# Patient Record
Sex: Female | Born: 1970 | Race: Black or African American | Hispanic: No | Marital: Married | Smoking: Never smoker
Health system: Southern US, Community
[De-identification: ages and names within clinical notes are randomized; demographics above are authoritative.]

## PROBLEM LIST (undated history)

## (undated) DIAGNOSIS — F419 Anxiety disorder, unspecified: Secondary | ICD-10-CM

## (undated) DIAGNOSIS — F32A Depression, unspecified: Secondary | ICD-10-CM

## (undated) DIAGNOSIS — I10 Essential (primary) hypertension: Secondary | ICD-10-CM

## (undated) DIAGNOSIS — G459 Transient cerebral ischemic attack, unspecified: Secondary | ICD-10-CM

## (undated) HISTORY — DX: Transient cerebral ischemic attack, unspecified: G45.9

## (undated) HISTORY — DX: Anxiety disorder, unspecified: F41.9

## (undated) HISTORY — DX: Depression, unspecified: F32.A

---

## 2001-11-24 ENCOUNTER — Emergency Department (HOSPITAL_COMMUNITY): Admission: EM | Admit: 2001-11-24 | Discharge: 2001-11-25 | Payer: Self-pay

## 2004-12-04 ENCOUNTER — Ambulatory Visit: Payer: Self-pay | Admitting: Obstetrics & Gynecology

## 2004-12-24 ENCOUNTER — Observation Stay: Payer: Self-pay

## 2005-02-11 ENCOUNTER — Inpatient Hospital Stay: Payer: Self-pay | Admitting: Obstetrics & Gynecology

## 2005-08-18 ENCOUNTER — Emergency Department: Payer: Self-pay | Admitting: Emergency Medicine

## 2011-11-25 ENCOUNTER — Ambulatory Visit: Payer: Self-pay | Admitting: Emergency Medicine

## 2011-11-25 LAB — HEPATIC FUNCTION PANEL A (ARMC)
Albumin: 3.6 g/dL (ref 3.4–5.0)
Bilirubin, Direct: <0.1 mg/dL (ref 0.00–0.20)
Bilirubin,Total: 0.5 mg/dL (ref 0.2–1.0)
SGOT(AST): 44 U/L — ABNORMAL HIGH (ref 15–37)

## 2011-11-27 LAB — PATHOLOGY REPORT

## 2013-11-22 ENCOUNTER — Encounter: Payer: Self-pay | Admitting: Podiatry

## 2013-11-22 ENCOUNTER — Ambulatory Visit (INDEPENDENT_AMBULATORY_CARE_PROVIDER_SITE_OTHER): Payer: BC Managed Care – PPO

## 2013-11-22 ENCOUNTER — Ambulatory Visit (INDEPENDENT_AMBULATORY_CARE_PROVIDER_SITE_OTHER): Payer: BC Managed Care – PPO | Admitting: Podiatry

## 2013-11-22 VITALS — BP 150/89 | HR 56 | Resp 16 | Ht 65.0 in | Wt 180.0 lb

## 2013-11-22 DIAGNOSIS — M79673 Pain in unspecified foot: Secondary | ICD-10-CM

## 2013-11-22 DIAGNOSIS — M79609 Pain in unspecified limb: Secondary | ICD-10-CM

## 2013-11-22 DIAGNOSIS — S93409A Sprain of unspecified ligament of unspecified ankle, initial encounter: Secondary | ICD-10-CM

## 2013-11-22 NOTE — Progress Notes (Signed)
   Subjective:    Patient ID: Margaret Gaines, female    DOB: 1971/05/31, 43 y.o.   MRN: [id number]  HPI Comments: My right foot is swollen on the ankle and it hurts up the back of the leg. This has been going on for 3 - 4 weeks. It started all the sudden and its remaining the same. i have soaked it in hot water and used muscle rub and elevation.  Foot Pain      Review of Systems  All other systems reviewed and are negative.      Objective:   Physical Exam        Assessment & Plan:

## 2013-11-22 NOTE — Progress Notes (Signed)
Subjective:     Patient ID: Margaret Gaines, female   DOB: 06-18-1971, 43 y.o.   MRN: [id number]  Foot Pain   patient presents stating she has pain in her right ankle and it's not real sore but is swollen and she is concerned she may have broken it or done some   Review of Systems  All other systems reviewed and are negative.      Objective:   Physical Exam  Nursing note and vitals reviewed. Constitutional: She is oriented to person, place, and time.  Cardiovascular: Intact distal pulses.   Musculoskeletal: Normal range of motion.  Neurological: She is oriented to person, place, and time.  Skin: Skin is warm.   vascular status is intact with range of motion of the subtalar and midtarsal joint found to be adequate. I noted there to be discomfort on the fibular malleolus with edema of the malleolus itself and no indication of Achilles tendon injury or peroneal tendon injury. Muscle strength was adequate and digits well perfused     Assessment:     Possible damage to the right ankle lateral side    Plan:     H&P and x-rays reviewed with patient. Advised on compression elevation and anti-inflammatory usage and reappoint if issues occur

## 2014-11-12 NOTE — Op Note (Signed)
PATIENT NAME:  Margaret Gaines, Margaret Gaines MR#:  [id number] DATE OF BIRTH:  10-14-1970  DATE OF PROCEDURE:  11/25/2011  PREOPERATIVE DIAGNOSIS: Acute cholecystitis, cholelithiasis.  POSTOPERATIVE DIAGNOSIS: Acute cholecystitis, cholelithiasis.  PROCEDURE PERFORMED: Laparoscopic cholecystectomy with cholangiogram.  SURGEON: Vella Kohler, MD  FINDINGS: The gallbladder had lots of large stones and was a very difficult dissection near the cystic duct and cystic duct gallbladder junction.   DESCRIPTION OF PROCEDURE: After the patient was put to sleep under general anesthesia, the abdomen was then prepped and draped. A small incision was made above the umbilicus. After cutting skin and subcutaneous tissue, the fascia was cut. The abdomen was entered with a trocar. After that another trocar was inserted in the epigastric region and two 5 mm put in the right upper quadrant of the abdomen. After we looked at the gallbladder, as soon as we went in, there were a lot of adhesions on the surface of the gallbladder. These were released. The duodenum was then pulled down. After that the gallbladder was lifted up. It could not be lifted too high from the liver edge, but another clamp was applied at the Hartmann's pouch. The patient had lots of big, big stones there. It was hard to grasp that area. It also looked the gallbladder was elongated at this area. I could not see the cystic duct in the beginning. In the beginning there were a lot of bands which were going from the Hartmann's pouch. I really cleaned them out and then put a little clip in and dissected them off. After taking off the cystic artery, the cystic duct looked small so I dissected it out very nicely and I did a cholangiogram through it and I could see the common bile duct and I could see the dye in the duodenum with no stones in the common duct. The cystic duct was then clipped right next to the tip of the cystic duct gallbladder junction. After that the gallbladder  was then taken off from the liver bed. The gallbladder had no peritoneum which was mostly intrahepatic so we had a little bit of oozing in the liver bed area, but I kind of stopped the bleeding and dissected up. After that I irrigated it out completely and again at the end we looked at it and placed a piece of Surgicel. After the      gallbladder was removed and all the trocars were removed, the umbilical port was closed with interrupted 0 Vicryl sutures and clips were applied. The patient tolerated the procedure well. ____________________________ Welford Roche. Phylis Bougie, MD msh:slb D: 11/25/2011 14:16:27 ET T: 11/25/2011 15:01:07 ET JOB#: [id number]  cc: Brynlee Pennywell S. Phylis Bougie, MD, <Dictator> Meindert A. Brunetta Genera, Kieler MD ELECTRONICALLY SIGNED 12/03/2011 11:31

## 2017-12-01 ENCOUNTER — Other Ambulatory Visit: Payer: Self-pay

## 2017-12-01 ENCOUNTER — Observation Stay: Payer: BLUE CROSS/BLUE SHIELD

## 2017-12-01 ENCOUNTER — Emergency Department: Payer: BLUE CROSS/BLUE SHIELD

## 2017-12-01 ENCOUNTER — Inpatient Hospital Stay
Admission: EM | Admit: 2017-12-01 | Discharge: 2017-12-03 | DRG: 066 | Disposition: A | Discharge status: Home Health | Payer: BLUE CROSS/BLUE SHIELD | Attending: Internal Medicine | Admitting: Internal Medicine

## 2017-12-01 DIAGNOSIS — Z888 Allergy status to other drugs, medicaments and biological substances status: Secondary | ICD-10-CM

## 2017-12-01 DIAGNOSIS — R519 Headache, unspecified: Secondary | ICD-10-CM

## 2017-12-01 DIAGNOSIS — Z91018 Allergy to other foods: Secondary | ICD-10-CM

## 2017-12-01 DIAGNOSIS — I6381 Other cerebral infarction due to occlusion or stenosis of small artery: Secondary | ICD-10-CM

## 2017-12-01 DIAGNOSIS — Z7982 Long term (current) use of aspirin: Secondary | ICD-10-CM

## 2017-12-01 DIAGNOSIS — I639 Cerebral infarction, unspecified: Principal | ICD-10-CM | POA: Diagnosis present

## 2017-12-01 DIAGNOSIS — Z9114 Patient's other noncompliance with medication regimen: Secondary | ICD-10-CM

## 2017-12-01 DIAGNOSIS — R202 Paresthesia of skin: Secondary | ICD-10-CM | POA: Diagnosis not present

## 2017-12-01 DIAGNOSIS — R51 Headache: Secondary | ICD-10-CM

## 2017-12-01 DIAGNOSIS — R29700 NIHSS score 0: Secondary | ICD-10-CM | POA: Diagnosis present

## 2017-12-01 DIAGNOSIS — I10 Essential (primary) hypertension: Secondary | ICD-10-CM | POA: Diagnosis present

## 2017-12-01 HISTORY — DX: Essential (primary) hypertension: I10

## 2017-12-01 LAB — COMPREHENSIVE METABOLIC PANEL
ALK PHOS: 63 U/L (ref 38–126)
ALT: 19 U/L (ref 14–54)
AST: 27 U/L (ref 15–41)
Albumin: 3.9 g/dL (ref 3.5–5.0)
Anion gap: 7 (ref 5–15)
BUN: 11 mg/dL (ref 6–20)
CALCIUM: 8.9 mg/dL (ref 8.9–10.3)
CO2: 26 mmol/L (ref 22–32)
CREATININE: 0.95 mg/dL (ref 0.44–1.00)
Chloride: 103 mmol/L (ref 101–111)
GFR calc non Af Amer: >60 mL/min (ref >60)
GLUCOSE: 101 mg/dL — AB (ref 65–99)
Potassium: 3.7 mmol/L (ref 3.5–5.1)
SODIUM: 136 mmol/L (ref 135–145)
Total Bilirubin: 0.5 mg/dL (ref 0.3–1.2)
Total Protein: 7.4 g/dL (ref 6.5–8.1)

## 2017-12-01 LAB — CBC WITH DIFFERENTIAL/PLATELET
Basophils Absolute: 0 10*3/uL (ref 0–0.1)
Basophils Relative: 1 %
EOS PCT: 4 %
Eosinophils Absolute: 0.1 10*3/uL (ref 0–0.7)
HCT: 37.5 % (ref 35.0–47.0)
HEMOGLOBIN: 12.8 g/dL (ref 12.0–16.0)
LYMPHS ABS: 1.2 10*3/uL (ref 1.0–3.6)
Lymphocytes Relative: 35 %
MCH: 28.8 pg (ref 26.0–34.0)
MCHC: 34.1 g/dL (ref 32.0–36.0)
MCV: 84.3 fL (ref 80.0–100.0)
MONO ABS: 0.2 10*3/uL (ref 0.2–0.9)
MONOS PCT: 6 %
NEUTROS PCT: 54 %
Neutro Abs: 1.8 10*3/uL (ref 1.4–6.5)
Platelets: 232 10*3/uL (ref 150–440)
RBC: 4.45 MIL/uL (ref 3.80–5.20)
RDW: 15.2 % — ABNORMAL HIGH (ref 11.5–14.5)
WBC: 3.5 10*3/uL — ABNORMAL LOW (ref 3.6–11.0)

## 2017-12-01 LAB — TROPONIN I: Troponin I: <0.03 ng/mL (ref <0.03)

## 2017-12-01 MED ORDER — HYDRALAZINE HCL 20 MG/ML IJ SOLN
10.0000 mg | Freq: Once | INTRAMUSCULAR | Status: AC
  Administered 2017-12-01: 10 mg via INTRAVENOUS
  Filled 2017-12-01: qty 1

## 2017-12-01 MED ORDER — ACETAMINOPHEN 160 MG/5ML PO SOLN
650.0000 mg | ORAL | Status: DC | PRN
  Filled 2017-12-01: qty 20.3

## 2017-12-01 MED ORDER — LABETALOL HCL 5 MG/ML IV SOLN
10.0000 mg | Freq: Once | INTRAVENOUS | Status: AC
  Administered 2017-12-01: 10 mg via INTRAVENOUS

## 2017-12-01 MED ORDER — ONDANSETRON HCL 4 MG/2ML IJ SOLN
4.0000 mg | INTRAMUSCULAR | Status: AC
  Administered 2017-12-01: 4 mg via INTRAVENOUS
  Filled 2017-12-01 (×2): qty 2

## 2017-12-01 MED ORDER — ACETAMINOPHEN 650 MG RE SUPP
650.0000 mg | RECTAL | Status: DC | PRN
  Administered 2017-12-03: 650 mg via RECTAL
  Filled 2017-12-01: qty 1

## 2017-12-01 MED ORDER — MORPHINE SULFATE (PF) 4 MG/ML IV SOLN
4.0000 mg | Freq: Once | INTRAVENOUS | Status: AC
  Administered 2017-12-01: 4 mg via INTRAVENOUS
  Filled 2017-12-01 (×2): qty 1

## 2017-12-01 MED ORDER — LABETALOL HCL 5 MG/ML IV SOLN
INTRAVENOUS | Status: AC
  Administered 2017-12-01: 19:00:00
  Filled 2017-12-01: qty 4

## 2017-12-01 MED ORDER — STROKE: EARLY STAGES OF RECOVERY BOOK
Freq: Once | Status: AC
  Administered 2017-12-02

## 2017-12-01 MED ORDER — ASPIRIN 81 MG PO CHEW
324.0000 mg | CHEWABLE_TABLET | Freq: Once | ORAL | Status: AC
  Administered 2017-12-01: 324 mg via ORAL
  Filled 2017-12-01: qty 4

## 2017-12-01 MED ORDER — ACETAMINOPHEN 325 MG PO TABS
650.0000 mg | ORAL_TABLET | ORAL | Status: DC | PRN
  Administered 2017-12-02 (×2): 650 mg via ORAL
  Filled 2017-12-01 (×2): qty 2

## 2017-12-01 MED ORDER — ENOXAPARIN SODIUM 40 MG/0.4ML ~~LOC~~ SOLN
40.0000 mg | SUBCUTANEOUS | Status: DC
  Administered 2017-12-02: 40 mg via SUBCUTANEOUS
  Filled 2017-12-01: qty 0.4

## 2017-12-01 NOTE — ED Notes (Signed)
Pt reports headache is back and would like pain meds at this time. VSS.

## 2017-12-01 NOTE — ED Notes (Signed)
Patient transported to MRI via stretcher.

## 2017-12-01 NOTE — ED Notes (Signed)
Patient is resting comfortably on stretcher. VSS. Pt awaiting MRI. Call bell within reach, will continue to monitor.

## 2017-12-01 NOTE — ED Provider Notes (Signed)
[facility] Emergency Department Provider Note  ____________________________________________   First MD Initiated Contact with Patient 12/01/17 1742     (approximate)  I have reviewed the triage vital signs and the nursing notes.   HISTORY  Chief Complaint Headache and Hypertension    HPI Margaret Gaines is a 47 y.o. female with medical history of poorly controlled hypertension and questionable compliance to her medication regimen who presents for evaluation of gradually worsening but severe headache.  She reports that the headache started gradually 3 days ago but got worse today.  This morning she also noticed some numbness down her left arm and to the left side of her lips which was new but is been present for longer than 6 hours today.  She denies any chest pain or shortness of breath.  Light does make her headache worse.  She has no history of migraine.  She has had no weakness in her arms or legs and no difficulty with ambulation although she has had dizziness and elevated blood pressure.  Her husband confirmed that she only intermittently takes her blood pressure medicine and she definitely has not taken any for the last 3 days.  She denies fever/chills, neck pain, neck stiffness, chest pain, shortness of breath, nausea, vomiting, abdominal pain, and dysuria.  Past Medical History:  Diagnosis Date  . Hypertension     There are no active problems to display for this patient.   Past Surgical History:  Procedure Laterality Date  . CESAREAN SECTION     x2     Prior to Admission medications   Medication Sig Start Date End Date Taking? Authorizing Provider  aspirin EC 81 MG tablet Take 81 mg by mouth daily.   Yes [provider]  lisinopril (PRINIVIL,ZESTRIL) 20 MG tablet Take 20 mg by mouth daily.   Yes [provider]  Multiple Vitamin (MULTIVITAMIN WITH MINERALS) TABS tablet Take 1 tablet by mouth daily.   Yes [provider]    Allergies Silicone and Apricot flavor  History reviewed. No pertinent family history.  Social History Social History   Tobacco Use  . Smoking status: Never Smoker  Substance Use Topics  . Alcohol use: Yes  . Drug use: Not on file    Review of Systems Constitutional: No fever/chills Eyes: No visual changes.  +photophobia ENT: No sore throat. Cardiovascular: Denies chest pain. Respiratory: Denies shortness of breath. Gastrointestinal: No abdominal pain.  No nausea, no vomiting.  No diarrhea.  No constipation. Genitourinary: Negative for dysuria. Musculoskeletal: Negative for neck pain.  Negative for back pain. Integumentary: Negative for rash. Neurological: Severe headache for 3 days, now with greater than 6 hours of numbness in her left upper extremity and left side of her lips.  No focal weakness, no difficulty with ambulation.  Generalized dizziness.   ____________________________________________   PHYSICAL EXAM:  VITAL SIGNS: ED Triage Vitals  Enc Vitals Group     BP 12/01/17 1745 (!) 228/127     Pulse Rate 12/01/17 1748 (!) 52     Resp 12/01/17 1745 14     Temp --      Temp src --      SpO2 12/01/17 1745 98 %     Weight 12/01/17 1749 79.4 kg (175 lb)     Height 12/01/17 1749 1.626 m (5\' 4" )     Head Circumference --      Peak Flow --      Pain Score 12/01/17 1749 9  Pain Loc --      Pain Edu? --      Excl. in [place]? --     Constitutional: Alert and oriented but slow to respond and appears uncomfortable. Eyes: Conjunctivae are normal. PERRL. EOMI. Head: Atraumatic. Nose: No congestion/rhinnorhea. Mouth/Throat: Mucous membranes are moist. Neck: No stridor.  No meningeal signs.   Cardiovascular: Normal rate, regular rhythm. Good peripheral circulation. Grossly normal heart sounds. Respiratory: Normal respiratory effort.  No retractions. Lungs CTAB. Gastrointestinal: Soft and nontender. No distention.  Musculoskeletal: No lower extremity tenderness  nor edema. No gross deformities of extremities. Neurologic:  Normal speech and language. No gross focal neurologic deficits are appreciated with good major muscle group strength throughout but still the subjective finding of some numbness in her left arm although she reports it is better than prior. Skin:  Skin is warm, dry and intact. No rash noted. Psychiatric: Mood and affect are normal. Speech and behavior are normal.  ____________________________________________   LABS (all labs ordered are listed, but only abnormal results are displayed)  Labs Reviewed  CBC WITH DIFFERENTIAL/PLATELET - Abnormal; Notable for the following components:      Result Value   WBC 3.5 (*)    RDW 15.2 (*)    All other components within normal limits  COMPREHENSIVE METABOLIC PANEL - Abnormal; Notable for the following components:   Glucose, Bld 101 (*)    All other components within normal limits  TROPONIN I  URINALYSIS, ROUTINE W REFLEX MICROSCOPIC   ____________________________________________  EKG  ED ECG REPORT I, Hinda Kehr, the attending physician, personally viewed and interpreted this ECG.  Date: 12/01/2017 EKG Time: 17: 24 Rate: 51 Rhythm: Sinus bradycardia QRS Axis: normal Intervals: normal ST/T Wave abnormalities: Non-specific ST segment / T-wave changes, but no evidence of acute ischemia. Narrative Interpretation: no evidence of acute ischemia   ____________________________________________  RADIOLOGY   ED MD interpretation: No acute abnormalities on noncontrast head CT according to radiology.  MR brain is pending.  Official radiology report(s): Ct Head Wo Contrast  Result Date: 12/01/2017 CLINICAL DATA:  Severe headache dizziness EXAM: CT HEAD WITHOUT CONTRAST TECHNIQUE: Contiguous axial images were obtained from the base of the skull through the vertex without intravenous contrast. COMPARISON:  None. FINDINGS: Brain: No evidence of acute infarction, hemorrhage,  hydrocephalus, extra-axial collection or mass lesion/mass effect. Vascular: Negative for hyperdense vessel Skull: Negative Sinuses/Orbits: Negative Other: None IMPRESSION: Normal CT head Electronically Signed   By: Franchot Gallo M.D.   On: 12/01/2017 18:28    ____________________________________________   PROCEDURES  Critical Care performed: Yes, see critical care procedure note(s)   Procedure(s) performed:   .Critical Care Performed by: Hinda Kehr, MD Authorized by: Hinda Kehr, MD   Critical care provider statement:    Critical care time (minutes):  30   Critical care time was exclusive of:  Separately billable procedures and treating other patients   Critical care was necessary to treat or prevent imminent or life-threatening deterioration of the following conditions: hypertensive emergency.   Critical care was time spent personally by me on the following activities:  Development of treatment plan with patient or surrogate, discussions with consultants, evaluation of patient's response to treatment, examination of patient, obtaining history from patient or surrogate, ordering and performing treatments and interventions, ordering and review of laboratory studies, ordering and review of radiographic studies, pulse oximetry, re-evaluation of patient's condition and review of old charts     ____________________________________________   [place] / [place] / ED  COURSE  As part of my medical decision making, I reviewed the following data within the [facility] notes reviewed and incorporated, Labs reviewed , EKG interpreted  and Patient signed out to Dr. Joni Fears.    Differential diagnosis includes, but is not limited to, migraine, hypertensive emergency, CVA, intracranial neoplasm, intracranial hemorrhage, less likely infectious process.  The patient's blood pressure is consistently elevated in the 228/120 range.  She is  noncompliant with her medication I am sure she is chronically hypertensive but this seems extreme.  Even if this is a CVA this is too high.  Her heart rate is only in the 50s so rather than a beta-blocker I will give her first dose of hydralazine 10 mg IV to see if this helps both with her blood pressure as well as with her symptoms.  I will hold off on any analgesia right now because I do want to see the effect of the blood pressure.  I will also obtain a noncontrast head CT to look for any acute abnormalities including hemorrhage although this is unlikely given that her symptoms have been gradually getting worse for 3 days.  Lab work is unremarkable with normal conference of metabolic panel and CBC other than a very slight leukopenia at 3.5 WBC.  Troponin is negative.   Clinical Course as of Dec 01 2040  Tue Dec 01, 2017  1839 Normal CT head without any evidence of acute intracranial bleeding  CT Head Wo Contrast [CF]  1850 Patient still having headache and her blood pressures currently about 195/110 with a heart rate of about 72.  I will try labetalol 10 mg IV to see how she responds.  Lab work is reassuring with no evidence of renal dysfunction and CBC is within normal limits.  Troponin is negative.   [CF]  1907 Patient's blood pressure improved slightly after the labetalol.  She said her headache is down to an 8 or 9.  However it is possible that she is actually having a mild CVA which is leading her body to become hypertensive rather than this being a hypertensive emergency.  Given the risks of me continuing to treat the hypertension when she should actually be allowed to have permissive hypertension, I am ordering an MR brain without contrast to look for any evidence of CVA.  Her blood pressure is now in an appropriate range for permissive hypertension (it still needed to be treated earlier).  She does not have any numbness or tingling in her left arm now which is reassuring so she would not be a  good candidate for TPA even if this was a CVA given the rapid improvement in symptoms and the initial high blood pressure.  I explained the need for the MR brain to the patient and her husband and they understand and agree with the plan.   [CF]  1928 For her persistent headache I am giving morphine 4 mg IV as well as Zofran 4 mg IV while we are awaiting the MRI.   [CF]  2033 Patient's headache resolved, refused morphine.  Awaiting MRI.  Blood pressure still elevated.  Transferring care to Dr. Joni Fears to follow up MRI.  Likely will need admission for uncontrolled hypertension / hypertensive emergency, but awaiting any additional BP treatment until we know if she requires permissive hypertension.   [CF]    Clinical Course User Index [CF] Hinda Kehr, MD    ____________________________________________  FINAL CLINICAL IMPRESSION(S) / ED DIAGNOSES  Final diagnoses:  Uncontrolled hypertension  Nonintractable headache, unspecified chronicity pattern, unspecified headache type  Paresthesia     MEDICATIONS GIVEN DURING THIS VISIT:  Medications  morphine 4 MG/ML injection 4 mg (0 mg Intravenous Hold 12/01/17 1959)  ondansetron (ZOFRAN) injection 4 mg (0 mg Intravenous Hold 12/01/17 2000)  hydrALAZINE (APRESOLINE) injection 10 mg (10 mg Intravenous Given 12/01/17 1822)  labetalol (NORMODYNE,TRANDATE) injection 10 mg (10 mg Intravenous Given 12/01/17 1855)  labetalol (NORMODYNE,TRANDATE) 5 MG/ML injection (  Given by Other 12/01/17 1856)     ED Discharge Orders    None       Note:  This document was prepared using Dragon voice recognition software and may include unintentional dictation errors.    Hinda Kehr, MD 12/01/17 2042

## 2017-12-01 NOTE — ED Provider Notes (Signed)
 -----------------------------------------   10:31 PM on 12/01/2017 -----------------------------------------  Assumed care from Dr. Karma Greaser with MRI pending to evaluate for possible stroke. 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 We'll hold off on further antihypertensives, plan for permissive hypertension to maintain cerebral perfusion in at risk area. I will give aspirin. Case discussed with the hospitalist for further stroke management.  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

## 2017-12-01 NOTE — ED Triage Notes (Signed)
Pt arrived via EMS and sent from urgent care for headache, dizziness and high BP. Reports HA started 3 days ago and got worse today. BP high for EMS 190's/100's. Pt reports numbness to left arm and lips. Pt denies CP or SOB. Pt CAOx4.

## 2017-12-01 NOTE — H&P (Signed)
[facility] [place] NAME: Margaret Gaines    MR#:  [id number]  DATE OF BIRTH:  03-05-1971  DATE OF ADMISSION:  12/01/2017  PRIMARY CARE PHYSICIAN: Patient, No Pcp Per   REQUESTING/REFERRING PHYSICIAN: Joni Fears, MD  CHIEF COMPLAINT:   Chief Complaint  Patient presents with  . Headache  . Hypertension    HISTORY OF PRESENT ILLNESS:  Margaret Gaines  is a 47 y.o. female who presents with headache and development of some left-sided sensory deficit.  Patient states that she woke up with a headache, and was going about her business today when her headache got worse around 10:00 am, and she started to develop some facial and left-sided sensory deficit.  Patient went home to lay down for a while and states that her symptoms did not get any better.  She came to ED for evaluation and here was found to have a new small stroke on MRI imaging.  Hospitalist were called for admission  PAST MEDICAL HISTORY:   Past Medical History:  Diagnosis Date  . Hypertension      PAST SURGICAL HISTORY:   Past Surgical History:  Procedure Laterality Date  . CESAREAN SECTION     x2      SOCIAL HISTORY:   Social History   Tobacco Use  . Smoking status: Never Smoker  Substance Use Topics  . Alcohol use: Yes     FAMILY HISTORY:  Family history reviewed and is non-contributory   DRUG ALLERGIES:   Allergies  Allergen Reactions  . Silicone   . Apricot Flavor Other (See Comments)    Tingling to lips    MEDICATIONS AT HOME:   Prior to Admission medications   Medication Sig Start Date End Date Taking? Authorizing Provider  aspirin EC 81 MG tablet Take 81 mg by mouth daily.   Yes [provider]  lisinopril (PRINIVIL,ZESTRIL) 20 MG tablet Take 20 mg by mouth daily.   Yes [provider]  Multiple Vitamin (MULTIVITAMIN WITH MINERALS) TABS tablet Take 1 tablet by mouth daily.   Yes [provider]    REVIEW OF  SYSTEMS:  Review of Systems  Constitutional: Negative for chills, fever, malaise/fatigue and weight loss.  HENT: Negative for ear pain, hearing loss and tinnitus.   Eyes: Negative for blurred vision, double vision, pain and redness.  Respiratory: Negative for cough, hemoptysis and shortness of breath.   Cardiovascular: Negative for chest pain, palpitations, orthopnea and leg swelling.  Gastrointestinal: Negative for abdominal pain, constipation, diarrhea, nausea and vomiting.  Genitourinary: Negative for dysuria, frequency and hematuria.  Musculoskeletal: Negative for back pain, joint pain and neck pain.  Skin:       No acne, rash, or lesions  Neurological: Positive for sensory change and headaches. Negative for dizziness, tremors, focal weakness and weakness.  Endo/Heme/Allergies: Negative for polydipsia. Does not bruise/bleed easily.  Psychiatric/Behavioral: Negative for depression. The patient is not nervous/anxious and does not have insomnia.      VITAL SIGNS:   Vitals:   12/01/17 1840 12/01/17 1850 12/01/17 1930 12/01/17 2100  BP: (!) 195/110 (!) 184/105 (!) 184/100 (!) 167/120  Pulse: 65 68 (!) 59 60  Resp: 13 12 17 11   SpO2: 100% 100% 100% 99%  Weight:      Height:       Wt Readings from Last 3 Encounters:  12/01/17 79.4 kg (175 lb)  11/22/13 81.6 kg (180 lb)    PHYSICAL EXAMINATION:  Physical Exam  Vitals reviewed. Constitutional: She is oriented to person, place, and time. She appears well-developed and well-nourished. No distress.  HENT:  Head: Normocephalic and atraumatic.  Mouth/Throat: Oropharynx is clear and moist.  Eyes: Pupils are equal, round, and reactive to light. Conjunctivae and EOM are normal. No scleral icterus.  Neck: Normal range of motion. Neck supple. No JVD present. No thyromegaly present.  Cardiovascular: Normal rate, regular rhythm and intact distal pulses. Exam reveals no gallop and no friction rub.  No murmur heard. Respiratory: Effort  normal and breath sounds normal. No respiratory distress. She has no wheezes. She has no rales.  GI: Soft. Bowel sounds are normal. She exhibits no distension. There is no tenderness.  Musculoskeletal: Normal range of motion. She exhibits no edema.  No arthritis, no gout  Lymphadenopathy:    She has no cervical adenopathy.  Neurological: She is alert and oriented to person, place, and time. No cranial nerve deficit.  Neurologic: Cranial nerves II-XII intact, Sensation intact to light touch/pinprick, though patient endorses difference in sensation between right and left upper extremity -endorsing some tingling in mild sensory deficit in her left upper extremity, 5/5 strength in all extremities, no dysarthria, no aphasia, no dysphagia  Skin: Skin is warm and dry. No rash noted. No erythema.  Psychiatric: She has a normal mood and affect. Her behavior is normal. Judgment and thought content normal.    LABORATORY PANEL:   CBC Recent Labs  Lab 12/01/17 1739  WBC 3.5*  HGB 12.8  HCT 37.5  PLT 232   ------------------------------------------------------------------------------------------------------------------  Chemistries  Recent Labs  Lab 12/01/17 1739  NA 136  K 3.7  CL 103  CO2 26  GLUCOSE 101*  BUN 11  CREATININE 0.95  CALCIUM 8.9  AST 27  ALT 19  ALKPHOS 63  BILITOT 0.5   ------------------------------------------------------------------------------------------------------------------  Cardiac Enzymes Recent Labs  Lab 12/01/17 1739  TROPONINI <0.03   ------------------------------------------------------------------------------------------------------------------  RADIOLOGY:  Ct Head Wo Contrast  Result Date: 12/01/2017 CLINICAL DATA:  Severe headache dizziness EXAM: CT HEAD WITHOUT CONTRAST TECHNIQUE: Contiguous axial images were obtained from the base of the skull through the vertex without intravenous contrast. COMPARISON:  None. FINDINGS: Brain: No evidence  of acute infarction, hemorrhage, hydrocephalus, extra-axial collection or mass lesion/mass effect. Vascular: Negative for hyperdense vessel Skull: Negative Sinuses/Orbits: Negative Other: None IMPRESSION: Normal CT head Electronically Signed   By: Franchot Gallo M.D.   On: 12/01/2017 18:28   Mr Brain Wo Contrast  Result Date: 12/01/2017 CLINICAL DATA:  47 y/o F; severe gradually worsening headache with numbness of the left arm and left side of lips. History of hypertension. EXAM: MRI HEAD WITHOUT CONTRAST TECHNIQUE: Multiplanar, multiecho pulse sequences of the brain and surrounding structures were obtained without intravenous contrast. COMPARISON:  12/01/2017 head CT. FINDINGS: Brain: 10 mm focus of reduced diffusion within the right ventral thalamus compatible with acute/early subacute infarction. No associated hemorrhage or mass effect. Few additional scattered punctate foci of T2 FLAIR hyperintense signal abnormality in white matter are compatible with mild chronic microvascular ischemic changes for age. No extra-axial collection, hydrocephalus, or effacement of basilar cisterns. Vascular: Normal flow voids. Skull and upper cervical spine: Normal marrow signal. Sinuses/Orbits: Negative. Other: None. IMPRESSION: 10 mm acute/early subacute infarction within the right ventral thalamus. No associated hemorrhage or mass effect. Mild chronic microvascular ischemic changes of the brain for age. These results were called by telephone at the time of interpretation on 12/01/2017 at 10:19 pm to Dr. Joni Fears, who verbally acknowledged these  results. Electronically Signed   By: Kristine Garbe M.D.   On: 12/01/2017 22:21    EKG:   Orders placed or performed during the hospital encounter of 12/01/17  . EKG 12-Lead  . EKG 12-Lead    IMPRESSION AND PLAN:  Principal Problem:   Stroke [facility]) -admit per stroke admission order set with appropriate imaging, labs, consults. Active Problems:   Hypertension  -permissive hypertension tonight, blood pressure goal less than 220/120, hold antihypertensives for now  Chart review performed and case discussed with ED provider. Labs, imaging and/or ECG reviewed by provider and discussed with patient/family. Management plans discussed with the patient and/or family.  DVT PROPHYLAXIS: SubQ lovenox  GI PROPHYLAXIS: None  ADMISSION STATUS: Observation  CODE STATUS: Full  TOTAL TIME TAKING CARE OF THIS PATIENT: 40 minutes.   Vinie Charity [place] 12/01/2017, 10:40 PM  [employer]  [phone]  CC: Primary care physician; Patient, No Pcp Per  Note:  This document was prepared using Dragon voice recognition software and may include unintentional dictation errors.

## 2017-12-02 ENCOUNTER — Encounter: Payer: Self-pay | Admitting: Neurology

## 2017-12-02 ENCOUNTER — Observation Stay
Admit: 2017-12-02 | Discharge: 2017-12-02 | Disposition: A | Discharge status: Home / Self Care | Payer: BLUE CROSS/BLUE SHIELD | Attending: Internal Medicine | Admitting: Internal Medicine

## 2017-12-02 DIAGNOSIS — I10 Essential (primary) hypertension: Secondary | ICD-10-CM | POA: Diagnosis present

## 2017-12-02 DIAGNOSIS — Z888 Allergy status to other drugs, medicaments and biological substances status: Secondary | ICD-10-CM | POA: Diagnosis not present

## 2017-12-02 DIAGNOSIS — R29700 NIHSS score 0: Secondary | ICD-10-CM | POA: Diagnosis present

## 2017-12-02 DIAGNOSIS — Z91018 Allergy to other foods: Secondary | ICD-10-CM | POA: Diagnosis not present

## 2017-12-02 DIAGNOSIS — R202 Paresthesia of skin: Secondary | ICD-10-CM | POA: Diagnosis present

## 2017-12-02 DIAGNOSIS — I63531 Cerebral infarction due to unspecified occlusion or stenosis of right posterior cerebral artery: Secondary | ICD-10-CM | POA: Diagnosis not present

## 2017-12-02 DIAGNOSIS — Z7982 Long term (current) use of aspirin: Secondary | ICD-10-CM | POA: Diagnosis not present

## 2017-12-02 DIAGNOSIS — Z9114 Patient's other noncompliance with medication regimen: Secondary | ICD-10-CM | POA: Diagnosis not present

## 2017-12-02 DIAGNOSIS — I639 Cerebral infarction, unspecified: Secondary | ICD-10-CM | POA: Diagnosis present

## 2017-12-02 LAB — LIPID PANEL
CHOLESTEROL: 172 mg/dL (ref 0–200)
HDL: 46 mg/dL (ref >40)
LDL CALC: 109 mg/dL — AB (ref 0–99)
Total CHOL/HDL Ratio: 3.7 RATIO
Triglycerides: 87 mg/dL (ref <150)
VLDL: 17 mg/dL (ref 0–40)

## 2017-12-02 LAB — ECHOCARDIOGRAM COMPLETE
Height: 64 in
Weight: 3022.4 oz

## 2017-12-02 LAB — SEDIMENTATION RATE: SED RATE: 9 mm/h (ref 0–20)

## 2017-12-02 LAB — HEMOGLOBIN A1C
HEMOGLOBIN A1C: 5.4 % (ref 4.8–5.6)
MEAN PLASMA GLUCOSE: 108.28 mg/dL

## 2017-12-02 LAB — ANTITHROMBIN III: AntiThromb III Func: 112 % (ref 75–120)

## 2017-12-02 MED ORDER — HYDROCODONE-ACETAMINOPHEN 5-325 MG PO TABS
1.0000 | ORAL_TABLET | ORAL | Status: DC | PRN
  Administered 2017-12-02: 1 via ORAL
  Filled 2017-12-02: qty 1

## 2017-12-02 MED ORDER — HYDRALAZINE HCL 20 MG/ML IJ SOLN
10.0000 mg | Freq: Four times a day (QID) | INTRAMUSCULAR | Status: DC | PRN
  Administered 2017-12-02 (×2): 10 mg via INTRAVENOUS
  Filled 2017-12-02 (×2): qty 1

## 2017-12-02 MED ORDER — IRBESARTAN 75 MG PO TABS
37.5000 mg | ORAL_TABLET | Freq: Once | ORAL | Status: AC
  Administered 2017-12-02: 37.5 mg via ORAL
  Filled 2017-12-02: qty 0.5

## 2017-12-02 MED ORDER — PANTOPRAZOLE SODIUM 40 MG PO TBEC
40.0000 mg | DELAYED_RELEASE_TABLET | Freq: Every day | ORAL | Status: DC
  Administered 2017-12-02 – 2017-12-03 (×2): 40 mg via ORAL
  Filled 2017-12-02 (×2): qty 1

## 2017-12-02 MED ORDER — AMLODIPINE BESYLATE 5 MG PO TABS
5.0000 mg | ORAL_TABLET | Freq: Every day | ORAL | Status: DC
  Administered 2017-12-02 – 2017-12-03 (×2): 5 mg via ORAL
  Filled 2017-12-02 (×2): qty 1

## 2017-12-02 MED ORDER — HYDROCHLOROTHIAZIDE 25 MG PO TABS
25.0000 mg | ORAL_TABLET | Freq: Every day | ORAL | Status: DC
  Administered 2017-12-02 – 2017-12-03 (×2): 25 mg via ORAL
  Filled 2017-12-02 (×2): qty 1

## 2017-12-02 MED ORDER — GADOBENATE DIMEGLUMINE 529 MG/ML IV SOLN
20.0000 mL | Freq: Once | INTRAVENOUS | Status: AC | PRN
  Administered 2017-12-02: 01:00:00 16 mL via INTRAVENOUS

## 2017-12-02 MED ORDER — CLOPIDOGREL BISULFATE 75 MG PO TABS
75.0000 mg | ORAL_TABLET | Freq: Every day | ORAL | Status: DC
  Administered 2017-12-02 – 2017-12-03 (×2): 75 mg via ORAL
  Filled 2017-12-02 (×2): qty 1

## 2017-12-02 MED ORDER — ROSUVASTATIN CALCIUM 10 MG PO TABS
10.0000 mg | ORAL_TABLET | Freq: Every day | ORAL | Status: DC
  Administered 2017-12-02 – 2017-12-03 (×2): 10 mg via ORAL
  Filled 2017-12-02 (×2): qty 1

## 2017-12-02 NOTE — Progress Notes (Signed)
Patient is having difficulties swallowing pills. Patient is able to eat food without any issues. Patient reports that the swallowing of pills is so difficult that it's the reason she do not take the pills at home. Patient's husband, mother, and father are all at the bedside. MD Mody notified.

## 2017-12-02 NOTE — Progress Notes (Signed)
Took over pt care at 1500, family at bedside, pt resting quietly with no complaints

## 2017-12-02 NOTE — Progress Notes (Signed)
[facility] [facility] [place] NAME: Margaret Gaines    MR#:  [id number]  DATE OF BIRTH:  07/25/1970  SUBJECTIVE:  Patient presented with headache and dizziness.  Her symptoms are improving.  No other neurological deficit noted.  REVIEW OF SYSTEMS:    Review of Systems  Constitutional: Negative for fever, chills weight loss HENT: Negative for ear pain, nosebleeds, congestion, facial swelling, rhinorrhea, neck pain, neck stiffness and ear discharge.   Respiratory: Negative for cough, shortness of breath, wheezing  Cardiovascular: Negative for chest pain, palpitations and leg swelling.  Gastrointestinal: Negative for heartburn, abdominal pain, vomiting, diarrhea or consitpation Genitourinary: Negative for dysuria, urgency, frequency, hematuria Musculoskeletal: Negative for back pain or joint pain Neurological: Positive for headache and dizziness however improved Hematological: Does not bruise/bleed easily.  Psychiatric/Behavioral: Negative for hallucinations, confusion, dysphoric mood    Tolerating Diet: yes      DRUG ALLERGIES:   Allergies  Allergen Reactions  . Silicone   . Apricot Flavor Other (See Comments)    Tingling to lips    VITALS:  Blood pressure (!) 157/114, pulse (!) 58, temperature 97.7 F (36.5 C), temperature source Oral, resp. rate 17, height 5\' 4"  (1.626 m), weight 85.7 kg (188 lb 14.4 oz), last menstrual period 12/01/2017, SpO2 100 %.  PHYSICAL EXAMINATION:  Constitutional: Appears well-developed and well-nourished. No distress. HENT: Normocephalic. Marland Kitchen Oropharynx is clear and moist.  Eyes: Conjunctivae and EOM are normal. PERRLA, no scleral icterus.  Neck: Normal ROM. Neck supple. No JVD. No tracheal deviation. CVS: RRR, S1/S2 +, no murmurs, no gallops, no carotid bruit.  Pulmonary: Effort and breath sounds normal, no stridor, rhonchi, wheezes, rales.  Abdominal: Soft. BS +,  no distension, tenderness, rebound or  guarding.  Musculoskeletal: Normal range of motion. No edema and no tenderness.  Neuro: Alert. CN 2-12 grossly intact. No focal deficits. Skin: Skin is warm and dry. No rash noted. Psychiatric: Normal mood and affect.      LABORATORY PANEL:   CBC Recent Labs  Lab 12/01/17 1739  WBC 3.5*  HGB 12.8  HCT 37.5  PLT 232   ------------------------------------------------------------------------------------------------------------------  Chemistries  Recent Labs  Lab 12/01/17 1739  NA 136  K 3.7  CL 103  CO2 26  GLUCOSE 101*  BUN 11  CREATININE 0.95  CALCIUM 8.9  AST 27  ALT 19  ALKPHOS 63  BILITOT 0.5   ------------------------------------------------------------------------------------------------------------------  Cardiac Enzymes Recent Labs  Lab 12/01/17 1739  TROPONINI <0.03   ------------------------------------------------------------------------------------------------------------------  RADIOLOGY:  Ct Head Wo Contrast  Result Date: 12/01/2017 CLINICAL DATA:  Severe headache dizziness EXAM: CT HEAD WITHOUT CONTRAST TECHNIQUE: Contiguous axial images were obtained from the base of the skull through the vertex without intravenous contrast. COMPARISON:  None. FINDINGS: Brain: No evidence of acute infarction, hemorrhage, hydrocephalus, extra-axial collection or mass lesion/mass effect. Vascular: Negative for hyperdense vessel Skull: Negative Sinuses/Orbits: Negative Other: None IMPRESSION: Normal CT head Electronically Signed   By: Franchot Gallo M.D.   On: 12/01/2017 18:28   Mr Jodene Nam Neck W Wo Contrast  Result Date: 12/02/2017 CLINICAL DATA:  47 y/o  F; headache and left-sided sensory deficit. EXAM: MRA NECK WITHOUT AND WITH CONTRAST MRA HEAD WITHOUT CONTRAST TECHNIQUE: Multiplanar and multiecho pulse sequences of the neck were obtained without and with intravenous contrast. Angiographic images of the neck were obtained using MRA technique without and with  intravenous contast.; Angiographic images of the Circle of Willis were obtained using MRA technique without intravenous  contrast. CONTRAST:  13mL MULTIHANCE GADOBENATE DIMEGLUMINE 529 MG/ML IV SOLN COMPARISON:  12/01/2017 CT head FINDINGS: MRA NECK FINDINGS Aortic arch: Patent.  Bovine variant branching. Right common carotid artery: Patent. Right internal carotid artery: Patent. Right vertebral artery: Patent. Left common carotid artery: Patent. Left Internal carotid artery: Patent. Left Vertebral artery: Patent. There is no evidence of hemodynamically significant stenosis by NASCET criteria, occlusion, or aneurysm unless noted above. MRA HEAD FINDINGS Internal carotid arteries:  Patent. Anterior cerebral arteries:  Patent.  Azygos A2. Middle cerebral arteries: Patent. Anterior communicating artery: Patent. Posterior communicating arteries:  Patent. Apparent segments of stenosis of left terminal ICA, bilateral A1, and bilateral M1 segments are are widely patent on the fluid postcontrast MRA of the neck and likely due to motion artifact/sinus susceptibility artifact. Posterior cerebral arteries: Patent. Severe right P2 stenosis also present on the postcontrast MRA of the neck (series 15, image 43). Basilar artery:  Patent. Vertebral arteries:  Patent. No large vessel occlusion or aneurysm identified. IMPRESSION: 1. Patent carotid and vertebral arteries. No dissection, aneurysm, or hemodynamically significant stenosis utilizing NASCET criteria. 2. Patent anterior and posterior intracranial circulation. No large vessel occlusion or aneurysm. 3. 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 Otherwise, no significant stenosis. Electronically Signed   By: Kristine Garbe M.D.   On: 12/02/2017 02:01   Mr Brain Wo Contrast  Result Date: 12/01/2017 CLINICAL DATA:  47 y/o F; severe gradually worsening headache with numbness of the left arm and left side of lips. History of hypertension. EXAM: MRI  HEAD WITHOUT CONTRAST TECHNIQUE: Multiplanar, multiecho pulse sequences of the brain and surrounding structures were obtained without intravenous contrast. COMPARISON:  12/01/2017 head CT. FINDINGS: Brain: 10 mm focus of reduced diffusion within the right ventral thalamus compatible with acute/early subacute infarction. No associated hemorrhage or mass effect. Few additional scattered punctate foci of T2 FLAIR hyperintense signal abnormality in white matter are compatible with mild chronic microvascular ischemic changes for age. No extra-axial collection, hydrocephalus, or effacement of basilar cisterns. Vascular: Normal flow voids. Skull and upper cervical spine: Normal marrow signal. Sinuses/Orbits: Negative. Other: None. IMPRESSION: 10 mm acute/early subacute infarction within the right ventral thalamus. No associated hemorrhage or mass effect. Mild chronic microvascular ischemic changes of the brain for age. These results were called by telephone at the time of interpretation on 12/01/2017 at 10:19 pm to Dr. Joni Fears, who verbally acknowledged these results. Electronically Signed   By: Kristine Garbe M.D.   On: 12/01/2017 22:21   Mr Jodene Nam Head/brain YY Cm  Result Date: 12/02/2017 CLINICAL DATA:  47 y/o  F; headache and left-sided sensory deficit. EXAM: MRA NECK WITHOUT AND WITH CONTRAST MRA HEAD WITHOUT CONTRAST TECHNIQUE: Multiplanar and multiecho pulse sequences of the neck were obtained without and with intravenous contrast. Angiographic images of the neck were obtained using MRA technique without and with intravenous contast.; Angiographic images of the Circle of Willis were obtained using MRA technique without intravenous contrast. CONTRAST:  64mL MULTIHANCE GADOBENATE DIMEGLUMINE 529 MG/ML IV SOLN COMPARISON:  12/01/2017 CT head FINDINGS: MRA NECK FINDINGS Aortic arch: Patent.  Bovine variant branching. Right common carotid artery: Patent. Right internal carotid artery: Patent. Right vertebral  artery: Patent. Left common carotid artery: Patent. Left Internal carotid artery: Patent. Left Vertebral artery: Patent. There is no evidence of hemodynamically significant stenosis by NASCET criteria, occlusion, or aneurysm unless noted above. MRA HEAD FINDINGS Internal carotid arteries:  Patent. Anterior cerebral arteries:  Patent.  Azygos A2. Middle cerebral arteries: Patent. Anterior  communicating artery: Patent. Posterior communicating arteries:  Patent. Apparent segments of stenosis of left terminal ICA, bilateral A1, and bilateral M1 segments are are widely patent on the fluid postcontrast MRA of the neck and likely due to motion artifact/sinus susceptibility artifact. Posterior cerebral arteries: Patent. Severe right P2 stenosis also present on the postcontrast MRA of the neck (series 15, image 43). Basilar artery:  Patent. Vertebral arteries:  Patent. No large vessel occlusion or aneurysm identified. IMPRESSION: 1. Patent carotid and vertebral arteries. No dissection, aneurysm, or hemodynamically significant stenosis utilizing NASCET criteria. 2. Patent anterior and posterior intracranial circulation. No large vessel occlusion or aneurysm. 3. 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 Otherwise, no significant stenosis. Electronically Signed   By: Kristine Garbe M.D.   On: 12/02/2017 02:01     ASSESSMENT AND PLAN:   47 year old female with history of essential hypertension who presented with headache and dizziness.  1.10 mm acute/early subacute infarction within the right ventral  thalamus. No associated hemorrhage or mass effect.  Patient will need TEE. Start Plavix and statin Appreciate neurology consultation MRI/MRA as above LDL 109 A1c 5.4  2.  Accelerated essential hypertension due to CVA Start HCTZ and Norvasc   Management plans discussed with the patient and she is in agreement.  CODE STATUS: full  TOTAL TIME TAKING CARE OF THIS PATIENT: 30 minutes.    D/w dr Doy Mince  POSSIBLE D/C tomorrow, DEPENDING ON CLINICAL CONDITION.   Audrey Thull M.D on 12/02/2017 at 11:18 AM  Between 7am to 6pm - Pager - [phone] After 6pm go to [email or web address] - password [email or web address] [facility]  Office  [phone]  CC: Primary care physician; Patient, No Pcp Per  Note: This dictation was prepared with Dragon dictation along with smaller phrase technology. Any transcriptional errors that result from this process are unintentional.

## 2017-12-02 NOTE — Evaluation (Signed)
Physical Therapy Evaluation Patient Details Name: ASHAUNTE STANDLEY MRN: [id number] DOB: Mar 21, 1971 Today's Date: 12/02/2017   History of Present Illness  Patient is a 47 year old female admitted for a thalamic stroke following c/o N/T in facial area.  PMH includes Htn.  Clinical Impression  Patient is a 47 year old female who lives in one story home with her husband.  She is independent and exercises regularly at baseline.  Pt reports an intense HA when PT arrived and states that her only sx are N/T in R side of face and lips.  Pt able to perform bed mobility independently and demonstrate good sitting balance.  MMT reveals good (4+/5, 5/5) bilaterally in LE/UE.  Pt required unilateral UE to stand, using counter and appeared slightly unsteady on feet.  She was able ambulate in room 50 ft with occasional unilateral use of furniture to steady herself.  Pt gait, static balance testing and dynamic balance reveal balance deficits and fall risk.  PT discussed possibility of using SPC for fall prevention during the recovery process and pt expressed interest.  All coordination testing WNL.  Pt will benefit from skilled PT with focus on HEP, balance and fall prevention as well as proper use of AD.    Follow Up Recommendations Home health PT    Equipment Recommendations  None recommended by PT    Recommendations for Other Services       Precautions / Restrictions Precautions Precautions: Fall Restrictions Weight Bearing Restrictions: No      Mobility  Bed Mobility Overal bed mobility: Independent                Transfers Overall transfer level: Needs assistance   Transfers: Sit to/from Stand Sit to Stand: Supervision         General transfer comment: Pt used counter to pull herself up to standing, appearing unsteady on feet.  No LOB.  Ambulation/Gait Ambulation/Gait assistance: Supervision Ambulation Distance (Feet): 50 Feet       Gait velocity interpretation: 1.31 - 2.62  ft/sec, indicative of limited community ambulator General Gait Details: Good foot clearance adn hip and knee flexion with occasional furniture cruising and mild lateral deviation.  Pt states that this is not baseline.  Stairs            Wheelchair Mobility    Modified Rankin (Stroke Patients Only)       Balance Overall balance assessment: Needs assistance Sitting-balance support: Feet supported Sitting balance-Leahy Scale: Normal     Standing balance support: Single extremity supported Standing balance-Leahy Scale: Fair Standing balance comment: Occasional need for unilateral UE to steady herself     Tandem Stance - Right Leg: 8(Increased lateral sway) Tandem Stance - Left Leg: 8(Increased lateral sway.) Rhomberg - Eyes Opened: 10 Rhomberg - Eyes Closed: 10(Increased a-p sway with close CGA)                 Pertinent Vitals/Pain Pain Assessment: Faces Faces Pain Scale: Hurts whole lot Pain Descriptors / Indicators: Headache Pain Intervention(s): Limited activity within patient's tolerance    Home Living Family/patient expects to be discharged to:: Private residence Living Arrangements: Spouse/significant other Available Help at Discharge: Family;Available PRN/intermittently Type of Home: House Home Access: Stairs to enter Entrance Stairs-Rails: Can reach both Entrance Stairs-Number of Steps: 5 Home Layout: One level Home Equipment: None Additional Comments: Pt's husband states that they will purchase a cane if needed.    Prior Function Level of Independence: Independent  Comments: Pt is active and exercises regularly.     Hand Dominance        Extremity/Trunk Assessment   Upper Extremity Assessment Upper Extremity Assessment: Overall WFL for tasks assessed(Grossly 4+/5 bilaterally)    Lower Extremity Assessment Lower Extremity Assessment: Overall WFL for tasks assessed(Grossly 5/5 bilaterally)    Cervical / Trunk  Assessment Cervical / Trunk Assessment: Normal  Communication   Communication: No difficulties  Cognition Arousal/Alertness: Lethargic Behavior During Therapy: Restless Overall Cognitive Status: Within Functional Limits for tasks assessed                                 General Comments: Pt able to answer questions and follow commands.  Slow to respond at times but appeared to be in a lot of pain due to HA, closing eyes frequently.      General Comments      Exercises     Assessment/Plan    PT Assessment Patient needs continued PT services  PT Problem List Decreased balance;Decreased knowledge of use of DME       PT Treatment Interventions DME instruction;Therapeutic activities;Gait training;Therapeutic exercise;Patient/family education;Stair training;Balance training;Functional mobility training;Neuromuscular re-education    PT Goals (Current goals can be found in the Care Plan section)  Acute Rehab PT Goals Patient Stated Goal: To return home and back to exercise routine. PT Goal Formulation: With patient/family Time For Goal Achievement: 12/16/17 Potential to Achieve Goals: Good    Frequency 7X/week   Barriers to discharge        Co-evaluation               AM-PAC PT "6 Clicks" Daily Activity  Outcome Measure Difficulty turning over in bed (including adjusting bedclothes, sheets and blankets)?: None Difficulty moving from lying on back to sitting on the side of the bed? : A Little Difficulty sitting down on and standing up from a chair with arms (e.g., wheelchair, bedside commode, etc,.)?: A Little Help needed moving to and from a bed to chair (including a wheelchair)?: A Little Help needed walking in hospital room?: A Little Help needed climbing 3-5 steps with a railing? : A Little 6 Click Score: 19    End of Session Equipment Utilized During Treatment: Gait belt Activity Tolerance: Patient limited by pain Patient left: in bed;with call  bell/phone within reach;with bed alarm set;with family/visitor present   PT Visit Diagnosis: Unsteadiness on feet (R26.81)    Time: 1140-1200 PT Time Calculation (min) (ACUTE ONLY): 20 min   Charges:   PT Evaluation $PT Eval Low Complexity: 1 Low     PT G Codes:   PT G-Codes **NOT FOR INPATIENT CLASS** Functional Assessment Tool Used: AM-PAC 6 Clicks Basic Mobility    Roxanne Gates, PT, DPT   Roxanne Gates 12/02/2017, 12:14 PM

## 2017-12-02 NOTE — Progress Notes (Signed)
*  PRELIMINARY RESULTS* Echocardiogram 2D Echocardiogram has been performed.  Margaret Gaines 12/02/2017, 9:07 AM

## 2017-12-02 NOTE — Progress Notes (Signed)
SLP Cancellation Note  Patient Details Name: Margaret Gaines MRN: [id number] DOB: 1971/06/22   Cancelled treatment:       Reason Eval/Treat Not Completed: SLP screened, no needs identified, will sign off(chart reviewed; consulted NSG then met w/ pt/family). Pt denied any difficulty swallowing and is currently on a regular diet; tolerates swallowing pills w/ water per NSG. Pt conversed at conversational level w/out deficits noted; pt and family denied any speech-language deficits. Pt had questions and concerns re: her s/s of Reflux-like issues - encouraged her to talk w/ MD re: such and need for management of.  No further skilled ST services indicated as pt appears at her baseline. Pt agreed. NSG to reconsult if any change in status.    Orinda Kenner, MS, CCC-SLP Watson,Katherine 12/02/2017, 12:12 PM

## 2017-12-02 NOTE — Progress Notes (Signed)
OT Cancellation Note  Patient Details Name: Margaret Gaines MRN: [id number] DOB: 10-27-1970   Cancelled Treatment:    Reason Eval/Treat Not Completed: Fatigue/lethargy limiting ability to participate(Pt. sleeping soundly. Pt. family requested to let pt. sleep secondary to having had a headache. Pt. BP has been elevated throughout the day, with the most recent reading 191/120. Will reattempt initial OT visit on the next available date.)  Harrel Carina, MS, OTR/L 12/02/2017, 3:27 PM

## 2017-12-02 NOTE — Consult Note (Signed)
Referring Physician: Mody    Chief Complaint: Left sided numbness  HPI: AALYSSA ELDERKIN is an 47 y.o. female with a history of hypertension who has not been compliant with her medications.  Reports that about 2 weeks ago had the onset of numbness on her tongue and lower lip.  Symptoms remained and yesterday she developed a headache.  At about 10-1030a also noted development of numbness on the left side of her body.  With no improvement in symptoms presented for evaluation.  Initial NIHSS of 0.    Date last known well: Unable to determine Time last known well: Unable to determine tPA Given: No: Outside treatment window  Past Medical History:  Diagnosis Date  . Hypertension     Past Surgical History:  Procedure Laterality Date  . CESAREAN SECTION     x2     Family history: no family history of stroke  Social History:  reports that she has never smoked. She has never used smokeless tobacco. She reports that she drinks alcohol. Her drug history is not on file.  Allergies:  Allergies  Allergen Reactions  . Silicone   . Apricot Flavor Other (See Comments)    Tingling to lips    Medications:  I have reviewed the patient's current medications. Prior to Admission:  Medications Prior to Admission  Medication Sig Dispense Refill Last Dose  . aspirin EC 81 MG tablet Take 81 mg by mouth daily.     Marland Kitchen lisinopril (PRINIVIL,ZESTRIL) 20 MG tablet Take 20 mg by mouth daily.     . Multiple Vitamin (MULTIVITAMIN WITH MINERALS) TABS tablet Take 1 tablet by mouth daily.      Scheduled: . enoxaparin (LOVENOX) injection  40 mg Subcutaneous Q24H    ROS: History obtained from the patient  General ROS: negative for - chills, fatigue, fever, night sweats, weight gain or weight loss Psychological ROS: negative for - behavioral disorder, hallucinations, memory difficulties, mood swings or suicidal ideation Ophthalmic ROS: negative for - blurry vision, double vision, eye pain or loss of vision ENT  ROS: negative for - epistaxis, nasal discharge, oral lesions, sore throat, tinnitus or vertigo Allergy and Immunology ROS: negative for - hives or itchy/watery eyes Hematological and Lymphatic ROS: negative for - bleeding problems, bruising or swollen lymph nodes Endocrine ROS: negative for - galactorrhea, hair pattern changes, polydipsia/polyuria or temperature intolerance Respiratory ROS: negative for - cough, hemoptysis, shortness of breath or wheezing Cardiovascular ROS: negative for - chest pain, dyspnea on exertion, edema or irregular heartbeat Gastrointestinal ROS: negative for - abdominal pain, diarrhea, hematemesis, nausea/vomiting or stool incontinence Genito-Urinary ROS: negative for - dysuria, hematuria, incontinence or urinary frequency/urgency Musculoskeletal ROS: negative for - joint swelling or muscular weakness Neurological ROS: as noted in HPI Dermatological ROS: negative for rash and skin lesion changes  Physical Examination: Blood pressure (!) 157/114, pulse (!) 58, temperature 97.7 F (36.5 C), temperature source Oral, resp. rate 17, height _0  (1.626 m), weight 85.7 kg (188 lb 14.4 oz), last menstrual period 12/01/2017, SpO2 100 %.  HEENT-  Normocephalic, no lesions, without obvious abnormality.  Normal external eye and conjunctiva.  Normal TM's bilaterally.  Normal auditory canals and external ears. Normal external nose, mucus membranes and septum.  Normal pharynx. Cardiovascular- S1, S2 normal, pulses palpable throughout   Lungs- chest clear, no wheezing, rales, normal symmetric air entry Abdomen- soft, non-tender; bowel sounds normal; no masses,  no organomegaly Extremities- no edema Lymph-no adenopathy palpable Musculoskeletal-no joint tenderness, deformity or swelling Skin-warm  and dry, no hyperpigmentation, vitiligo, or suspicious lesions  Neurological Examination   Mental Status: Alert, oriented, thought content appropriate.  Speech fluent without evidence of  aphasia.  Able to follow 3 step commands without difficulty. Cranial Nerves: II: Discs flat bilaterally; Visual fields grossly normal, pupils equal, round, reactive to light and accommodation III,IV, VI: ptosis not present, extra-ocular motions intact bilaterally V,VII: smile symmetric, facial light touch sensation normal bilaterally VIII: hearing normal bilaterally IX,X: gag reflex present XI: bilateral shoulder shrug XII: midline tongue extension Motor: Right : Upper extremity   5/5    Left:     Upper extremity   5/5  Lower extremity   5/5     Lower extremity   5/5 Tone and bulk:normal tone throughout; no atrophy noted Sensory: Pinprick and light touch intact decreased in the left upper and lower extremities Deep Tendon Reflexes: 2+ and symmetric with absent AJ;s bilaterally Plantars: Right: downgoing   Left: downgoing Cerebellar: Normal finger-to-nose and normal heel-to-shin testing bilaterally Gait: not tested due to safety concerns    Laboratory Studies:  Basic Metabolic Panel: Recent Labs  Lab 12/01/17 1739  NA 136  K 3.7  CL 103  CO2 26  GLUCOSE 101*  BUN 11  CREATININE 0.95  CALCIUM 8.9    Liver Function Tests: Recent Labs  Lab 12/01/17 1739  AST 27  ALT 19  ALKPHOS 63  BILITOT 0.5  PROT 7.4  ALBUMIN 3.9   No results for input(s): LIPASE, AMYLASE in the last 168 hours. No results for input(s): AMMONIA in the last 168 hours.  CBC: Recent Labs  Lab 12/01/17 1739  WBC 3.5*  NEUTROABS 1.8  HGB 12.8  HCT 37.5  MCV 84.3  PLT 232    Cardiac Enzymes: Recent Labs  Lab 12/01/17 1739  TROPONINI <0.03    BNP: Invalid input(s): POCBNP  CBG: No results for input(s): GLUCAP in the last 168 hours.  Microbiology: No results found for this or any previous visit.  Coagulation Studies: No results for input(s): LABPROT, INR in the last 72 hours.  Urinalysis: No results for input(s): COLORURINE, LABSPEC, PHURINE, GLUCOSEU, HGBUR, BILIRUBINUR,  KETONESUR, PROTEINUR, UROBILINOGEN, NITRITE, LEUKOCYTESUR in the last 168 hours.  Invalid input(s): APPERANCEUR  Lipid Panel:    Component Value Date/Time   CHOL 172 12/02/2017 0526   TRIG 87 12/02/2017 0526   HDL 46 12/02/2017 0526   CHOLHDL 3.7 12/02/2017 0526   VLDL 17 12/02/2017 0526   LDLCALC 109 (H) 12/02/2017 0526    HgbA1C:  Lab Results  Component Value Date   HGBA1C 5.4 12/02/2017    Urine Drug Screen:  No results found for: LABOPIA, COCAINSCRNUR, LABBENZ, AMPHETMU, THCU, LABBARB  Alcohol Level: No results for input(s): ETH in the last 168 hours.  Other results: EKG: sinus rhythm at 51 bpm.  Imaging: Ct Head Wo Contrast  Result Date: 12/01/2017 CLINICAL DATA:  Severe headache dizziness EXAM: CT HEAD WITHOUT CONTRAST TECHNIQUE: Contiguous axial images were obtained from the base of the skull through the vertex without intravenous contrast. COMPARISON:  None. FINDINGS: Brain: No evidence of acute infarction, hemorrhage, hydrocephalus, extra-axial collection or mass lesion/mass effect. Vascular: Negative for hyperdense vessel Skull: Negative Sinuses/Orbits: Negative Other: None IMPRESSION: Normal CT head Electronically Signed   By: Franchot Gallo M.D.   On: 12/01/2017 18:28   Mr Jodene Nam Neck W Wo Contrast  Result Date: 12/02/2017 CLINICAL DATA:  47 y/o  F; headache and left-sided sensory deficit. EXAM: MRA NECK WITHOUT AND WITH CONTRAST  MRA HEAD WITHOUT CONTRAST TECHNIQUE: Multiplanar and multiecho pulse sequences of the neck were obtained without and with intravenous contrast. Angiographic images of the neck were obtained using MRA technique without and with intravenous contast.; Angiographic images of the Circle of Willis were obtained using MRA technique without intravenous contrast. CONTRAST:  78m MULTIHANCE GADOBENATE DIMEGLUMINE 529 MG/ML IV SOLN COMPARISON:  12/01/2017 CT head FINDINGS: MRA NECK FINDINGS Aortic arch: Patent.  Bovine variant branching. Right common carotid  artery: Patent. Right internal carotid artery: Patent. Right vertebral artery: Patent. Left common carotid artery: Patent. Left Internal carotid artery: Patent. Left Vertebral artery: Patent. There is no evidence of hemodynamically significant stenosis by NASCET criteria, occlusion, or aneurysm unless noted above. MRA HEAD FINDINGS Internal carotid arteries:  Patent. Anterior cerebral arteries:  Patent.  Azygos A2. Middle cerebral arteries: Patent. Anterior communicating artery: Patent. Posterior communicating arteries:  Patent. Apparent segments of stenosis of left terminal ICA, bilateral A1, and bilateral M1 segments are are widely patent on the fluid postcontrast MRA of the neck and likely due to motion artifact/sinus susceptibility artifact. Posterior cerebral arteries: Patent. Severe right P2 stenosis also present on the postcontrast MRA of the neck (series 15, image 43). Basilar artery:  Patent. Vertebral arteries:  Patent. No large vessel occlusion or aneurysm identified. IMPRESSION: 1. Patent carotid and vertebral arteries. No dissection, aneurysm, or hemodynamically significant stenosis utilizing NASCET criteria. 2. Patent anterior and posterior intracranial circulation. No large vessel occlusion or aneurysm. 3. 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 Otherwise, no significant stenosis. Electronically Signed   By: LKristine GarbeM.D.   On: 12/02/2017 02:01   Mr Brain Wo Contrast  Result Date: 12/01/2017 CLINICAL DATA:  47y/o F; severe gradually worsening headache with numbness of the left arm and left side of lips. History of hypertension. EXAM: MRI HEAD WITHOUT CONTRAST TECHNIQUE: Multiplanar, multiecho pulse sequences of the brain and surrounding structures were obtained without intravenous contrast. COMPARISON:  12/01/2017 head CT. FINDINGS: Brain: 10 mm focus of reduced diffusion within the right ventral thalamus compatible with acute/early subacute infarction. No  associated hemorrhage or mass effect. Few additional scattered punctate foci of T2 FLAIR hyperintense signal abnormality in white matter are compatible with mild chronic microvascular ischemic changes for age. No extra-axial collection, hydrocephalus, or effacement of basilar cisterns. Vascular: Normal flow voids. Skull and upper cervical spine: Normal marrow signal. Sinuses/Orbits: Negative. Other: None. IMPRESSION: 10 mm acute/early subacute infarction within the right ventral thalamus. No associated hemorrhage or mass effect. Mild chronic microvascular ischemic changes of the brain for age. These results were called by telephone at the time of interpretation on 12/01/2017 at 10:19 pm to Dr. SJoni Fears who verbally acknowledged these results. Electronically Signed   By: LKristine GarbeM.D.   On: 12/01/2017 22:21   Mr MJodene NamHead/brain WTZCm  Result Date: 12/02/2017 CLINICAL DATA:  47y/o  F; headache and left-sided sensory deficit. EXAM: MRA NECK WITHOUT AND WITH CONTRAST MRA HEAD WITHOUT CONTRAST TECHNIQUE: Multiplanar and multiecho pulse sequences of the neck were obtained without and with intravenous contrast. Angiographic images of the neck were obtained using MRA technique without and with intravenous contast.; Angiographic images of the Circle of Willis were obtained using MRA technique without intravenous contrast. CONTRAST:  154mMULTIHANCE GADOBENATE DIMEGLUMINE 529 MG/ML IV SOLN COMPARISON:  12/01/2017 CT head FINDINGS: MRA NECK FINDINGS Aortic arch: Patent.  Bovine variant branching. Right common carotid artery: Patent. Right internal carotid artery: Patent. Right vertebral artery: Patent. Left common carotid  artery: Patent. Left Internal carotid artery: Patent. Left Vertebral artery: Patent. There is no evidence of hemodynamically significant stenosis by NASCET criteria, occlusion, or aneurysm unless noted above. MRA HEAD FINDINGS Internal carotid arteries:  Patent. Anterior cerebral arteries:   Patent.  Azygos A2. Middle cerebral arteries: Patent. Anterior communicating artery: Patent. Posterior communicating arteries:  Patent. Apparent segments of stenosis of left terminal ICA, bilateral A1, and bilateral M1 segments are are widely patent on the fluid postcontrast MRA of the neck and likely due to motion artifact/sinus susceptibility artifact. Posterior cerebral arteries: Patent. Severe right P2 stenosis also present on the postcontrast MRA of the neck (series 15, image 43). Basilar artery:  Patent. Vertebral arteries:  Patent. No large vessel occlusion or aneurysm identified. IMPRESSION: 1. Patent carotid and vertebral arteries. No dissection, aneurysm, or hemodynamically significant stenosis utilizing NASCET criteria. 2. Patent anterior and posterior intracranial circulation. No large vessel occlusion or aneurysm. 3. 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 Otherwise, no significant stenosis. Electronically Signed   By: Kristine Garbe M.D.   On: 12/02/2017 02:01    Assessment: 47 y.o. female presenting with hemisensory complaints.  MRI of the brain reviewed and shows an acute right thalamic infarct.  There is also evidence of other chronic small ischemic areas consistent with small vessel ischemic changes.  Although with patient being noncompliant with antihypertensives this is likely secondary to small vessel disease with other ischemic changes noted at young age will rule out embolic etiologies as well.   MRA of head and neck reveals severe right P2 segment stenosis.  Echocardiogram pending.  A1c 5.4, LDL 109.  Patient on ASA prior to admission.    Stroke Risk Factors - hypertension  Plan: 1. Hypercoagulable panel, ESR 2. PT consult, OT consult, Speech consult 3. Echocardiogram pending.  If unremarkable, TEE recommended.   4. Prophylactic therapy-Antiplatelet med: Plavix - dose 86m daily 5. NPO until RN stroke swallow screen 6. Telemetry monitoring 7.  Frequent neuro checks 8. Statin for lipid management with target LDL<70.   LAlexis Goodell MD Neurology [phone]/15/2019, 10:43 AM

## 2017-12-02 NOTE — Plan of Care (Signed)
Pt c/o of severe headache.  No improvement with tylenol.  Had given pt 10 mg hydralazine at 2024 for BP of 183/96.  It got down to 161/95; but when we rechecked before calling prime doc it was back up to 181/92.  Pt had gotten up to BR and could barely stand - had to be wheeled back to bed.  Prime doc put in order for Norco and irbesartan 37.5 mg.

## 2017-12-03 ENCOUNTER — Encounter: Payer: Self-pay | Admitting: Cardiovascular Disease

## 2017-12-03 ENCOUNTER — Inpatient Hospital Stay (HOSPITAL_COMMUNITY)
Admit: 2017-12-03 | Discharge: 2017-12-03 | Disposition: A | Discharge status: Home / Self Care | Payer: BLUE CROSS/BLUE SHIELD | Attending: Internal Medicine | Admitting: Internal Medicine

## 2017-12-03 ENCOUNTER — Encounter
Admission: EM | Disposition: A | Discharge status: Home Health | Payer: Self-pay | Source: Home / Self Care | Attending: Internal Medicine

## 2017-12-03 ENCOUNTER — Inpatient Hospital Stay: Admit: 2017-12-03 | Payer: BLUE CROSS/BLUE SHIELD

## 2017-12-03 DIAGNOSIS — I63531 Cerebral infarction due to unspecified occlusion or stenosis of right posterior cerebral artery: Secondary | ICD-10-CM

## 2017-12-03 HISTORY — PX: TEE WITHOUT CARDIOVERSION: SHX5443

## 2017-12-03 LAB — PROTEIN S ACTIVITY: PROTEIN S ACTIVITY: 78 % (ref 63–140)

## 2017-12-03 LAB — LUPUS ANTICOAGULANT PANEL
DRVVT: 30.2 s (ref 0.0–47.0)
PTT Lupus Anticoagulant: 27.6 s (ref 0.0–51.9)

## 2017-12-03 LAB — PROTEIN C ACTIVITY: PROTEIN C ACTIVITY: 145 % (ref 73–180)

## 2017-12-03 LAB — ECHOCARDIOGRAM COMPLETE
Height: 64 in
Weight: 3022.4 oz

## 2017-12-03 LAB — HIV ANTIBODY (ROUTINE TESTING W REFLEX): HIV Screen 4th Generation wRfx: NONREACTIVE

## 2017-12-03 LAB — PROTEIN S, TOTAL: Protein S Ag, Total: 98 % (ref 60–150)

## 2017-12-03 LAB — HOMOCYSTEINE: Homocysteine: 9.1 umol/L (ref 0.0–15.0)

## 2017-12-03 SURGERY — ECHOCARDIOGRAM, TRANSESOPHAGEAL
Anesthesia: Choice

## 2017-12-03 MED ORDER — FENTANYL CITRATE (PF) 100 MCG/2ML IJ SOLN
INTRAMUSCULAR | Status: AC
  Filled 2017-12-03: qty 4

## 2017-12-03 MED ORDER — AMLODIPINE BESYLATE 10 MG PO TABS: 10.0000 mg | ORAL_TABLET | Freq: Every day | ORAL | 0 refills | Status: AC

## 2017-12-03 MED ORDER — FENTANYL CITRATE (PF) 100 MCG/2ML IJ SOLN
INTRAMUSCULAR | Status: AC | PRN
  Administered 2017-12-03: 50 ug via INTRAVENOUS

## 2017-12-03 MED ORDER — HYDROCHLOROTHIAZIDE 25 MG PO TABS: 25.0000 mg | ORAL_TABLET | Freq: Every day | ORAL | 0 refills | Status: DC

## 2017-12-03 MED ORDER — PANTOPRAZOLE SODIUM 40 MG PO TBEC: 40.0000 mg | DELAYED_RELEASE_TABLET | Freq: Every day | ORAL | 0 refills | Status: DC

## 2017-12-03 MED ORDER — MIDAZOLAM HCL 5 MG/5ML IJ SOLN
INTRAMUSCULAR | Status: AC
  Filled 2017-12-03: qty 10

## 2017-12-03 MED ORDER — AMLODIPINE BESYLATE 10 MG PO TABS: 10.0000 mg | ORAL_TABLET | Freq: Every day | ORAL | 0 refills | Status: DC

## 2017-12-03 MED ORDER — LISINOPRIL 10 MG PO TABS: 10.0000 mg | ORAL_TABLET | Freq: Every day | ORAL | Status: DC

## 2017-12-03 MED ORDER — HYDROCHLOROTHIAZIDE 25 MG PO TABS: 25.0000 mg | ORAL_TABLET | Freq: Every day | ORAL | 0 refills | Status: AC

## 2017-12-03 MED ORDER — AMLODIPINE BESYLATE 10 MG PO TABS: 10.0000 mg | ORAL_TABLET | Freq: Every day | ORAL | Status: DC

## 2017-12-03 MED ORDER — MIDAZOLAM HCL 2 MG/2ML IJ SOLN
INTRAMUSCULAR | Status: AC | PRN
  Administered 2017-12-03: 2 mg via INTRAVENOUS

## 2017-12-03 MED ORDER — ROSUVASTATIN CALCIUM 10 MG PO TABS: 10.0000 mg | ORAL_TABLET | Freq: Every day | ORAL | 0 refills | Status: AC

## 2017-12-03 MED ORDER — SODIUM CHLORIDE 0.9 % IV SOLN: INTRAVENOUS | Status: DC

## 2017-12-03 MED ORDER — BUTAMBEN-TETRACAINE-BENZOCAINE 2-2-14 % EX AERO
INHALATION_SPRAY | CUTANEOUS | Status: AC
  Filled 2017-12-03: qty 5

## 2017-12-03 MED ORDER — CLOPIDOGREL BISULFATE 75 MG PO TABS: 75.0000 mg | ORAL_TABLET | Freq: Every day | ORAL | 0 refills | Status: DC

## 2017-12-03 NOTE — Care Management Note (Signed)
Case Management Note  Patient Details  Name: Margaret Gaines MRN: [id number] Date of Birth: 07/17/1971  Subjective/Objective:                  Admitted to [facility] with the diagnosis of stroke. Lives with husband Margaret Gaines [phone]). Last seen Dr. Brunetta Genera 12/02/17. Prescriptions are filled at [facility] in [place]. No home health or skilled nursing facilities in the past. No medical equipment in the home. Works at a job. Takes care of all basic and instrumental activities of daily living herself, drives. Family will transport  Action/Plan: Physical therapy evaluation completed. Recommending home with home health. Declining services at this time, not home bound.    Expected Discharge Date:  12/03/17               Expected Discharge Plan:     In-House Referral:     Discharge planning Services     Post Acute Care Choice:    Choice offered to:     DME Arranged:    DME Agency:     HH Arranged:    HH Agency:     Status of Service:     If discussed at [employer], dates discussed:    Additional Comments:  Shelbie Ammons, RN MSN CCM Care Management [phone] 12/03/2017, 2:51 PM

## 2017-12-03 NOTE — Progress Notes (Signed)
Subjective: No new neurological complaints.  Had severe headache on yesterday that is improved today.  Objective: Current vital signs: BP (!) 142/92   Pulse 63   Temp 98.7 F (37.1 C) (Oral)   Resp 15   Ht 5\' 4"  (1.626 m)   Wt 85.7 kg (188 lb 14.4 oz)   LMP 12/01/2017   SpO2 100%   BMI 32.42 kg/m  Vital signs in last 24 hours: Temp:  [98.2 F (36.8 C)-99 F (37.2 C)] 98.7 F (37.1 C) (05/16 0754) Pulse Rate:  [57-72] 63 (05/16 0754) Resp:  [15-18] 15 (05/16 0754) BP: (131-191)/(62-120) 142/92 (05/16 0754) SpO2:  [100 %] 100 % (05/16 0754)  Intake/Output from previous day: 05/15 0701 - 05/16 0700 In: 360 [P.O.:360] Out: 2050 [Urine:2050] Intake/Output this shift: No intake/output data recorded. Nutritional status:  Diet Order           Diet NPO time specified  Diet effective now          Neurologic Exam: Mental Status: Alert, oriented, thought content appropriate.  Speech fluent without evidence of aphasia.  Able to follow 3 step commands without difficulty. Cranial Nerves: II: Discs flat bilaterally; Visual fields grossly normal, pupils equal, round, reactive to light and accommodation III,IV, VI: ptosis not present, extra-ocular motions intact bilaterally V,VII: smile symmetric, facial light touch sensation normal bilaterally VIII: hearing normal bilaterally IX,X: gag reflex present XI: bilateral shoulder shrug XII: midline tongue extension Motor: 5/5 throughout Sensory: Pinprick and light touch intact bilaterally  Lab Results: Basic Metabolic Panel: Recent Labs  Lab 12/01/17 1739  NA 136  K 3.7  CL 103  CO2 26  GLUCOSE 101*  BUN 11  CREATININE 0.95  CALCIUM 8.9    Liver Function Tests: Recent Labs  Lab 12/01/17 1739  AST 27  ALT 19  ALKPHOS 63  BILITOT 0.5  PROT 7.4  ALBUMIN 3.9   No results for input(s): LIPASE, AMYLASE in the last 168 hours. No results for input(s): AMMONIA in the last 168 hours.  CBC: Recent Labs  Lab  12/01/17 1739  WBC 3.5*  NEUTROABS 1.8  HGB 12.8  HCT 37.5  MCV 84.3  PLT 232    Cardiac Enzymes: Recent Labs  Lab 12/01/17 1739  TROPONINI <0.03    Lipid Panel: Recent Labs  Lab 12/02/17 0526  CHOL 172  TRIG 87  HDL 46  CHOLHDL 3.7  VLDL 17  LDLCALC 109*    CBG: No results for input(s): GLUCAP in the last 168 hours.  Microbiology: No results found for this or any previous visit.  Coagulation Studies: No results for input(s): LABPROT, INR in the last 72 hours.  Imaging: Ct Head Wo Contrast  Result Date: 12/01/2017 CLINICAL DATA:  Severe headache dizziness EXAM: CT HEAD WITHOUT CONTRAST TECHNIQUE: Contiguous axial images were obtained from the base of the skull through the vertex without intravenous contrast. COMPARISON:  None. FINDINGS: Brain: No evidence of acute infarction, hemorrhage, hydrocephalus, extra-axial collection or mass lesion/mass effect. Vascular: Negative for hyperdense vessel Skull: Negative Sinuses/Orbits: Negative Other: None IMPRESSION: Normal CT head Electronically Signed   By: Franchot Gallo M.D.   On: 12/01/2017 18:28   Mr Jodene Nam Neck W Wo Contrast  Result Date: 12/02/2017 CLINICAL DATA:  47 y/o  F; headache and left-sided sensory deficit. EXAM: MRA NECK WITHOUT AND WITH CONTRAST MRA HEAD WITHOUT CONTRAST TECHNIQUE: Multiplanar and multiecho pulse sequences of the neck were obtained without and with intravenous contrast. Angiographic images of the neck were obtained  using MRA technique without and with intravenous contast.; Angiographic images of the Circle of Willis were obtained using MRA technique without intravenous contrast. CONTRAST:  26mL MULTIHANCE GADOBENATE DIMEGLUMINE 529 MG/ML IV SOLN COMPARISON:  12/01/2017 CT head FINDINGS: MRA NECK FINDINGS Aortic arch: Patent.  Bovine variant branching. Right common carotid artery: Patent. Right internal carotid artery: Patent. Right vertebral artery: Patent. Left common carotid artery: Patent. Left  Internal carotid artery: Patent. Left Vertebral artery: Patent. There is no evidence of hemodynamically significant stenosis by NASCET criteria, occlusion, or aneurysm unless noted above. MRA HEAD FINDINGS Internal carotid arteries:  Patent. Anterior cerebral arteries:  Patent.  Azygos A2. Middle cerebral arteries: Patent. Anterior communicating artery: Patent. Posterior communicating arteries:  Patent. Apparent segments of stenosis of left terminal ICA, bilateral A1, and bilateral M1 segments are are widely patent on the fluid postcontrast MRA of the neck and likely due to motion artifact/sinus susceptibility artifact. Posterior cerebral arteries: Patent. Severe right P2 stenosis also present on the postcontrast MRA of the neck (series 15, image 43). Basilar artery:  Patent. Vertebral arteries:  Patent. No large vessel occlusion or aneurysm identified. IMPRESSION: 1. Patent carotid and vertebral arteries. No dissection, aneurysm, or hemodynamically significant stenosis utilizing NASCET criteria. 2. Patent anterior and posterior intracranial circulation. No large vessel occlusion or aneurysm. 3. 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 Otherwise, no significant stenosis. Electronically Signed   By: Kristine Garbe M.D.   On: 12/02/2017 02:01   Mr Brain Wo Contrast  Result Date: 12/01/2017 CLINICAL DATA:  47 y/o F; severe gradually worsening headache with numbness of the left arm and left side of lips. History of hypertension. EXAM: MRI HEAD WITHOUT CONTRAST TECHNIQUE: Multiplanar, multiecho pulse sequences of the brain and surrounding structures were obtained without intravenous contrast. COMPARISON:  12/01/2017 head CT. FINDINGS: Brain: 10 mm focus of reduced diffusion within the right ventral thalamus compatible with acute/early subacute infarction. No associated hemorrhage or mass effect. Few additional scattered punctate foci of T2 FLAIR hyperintense signal abnormality in white  matter are compatible with mild chronic microvascular ischemic changes for age. No extra-axial collection, hydrocephalus, or effacement of basilar cisterns. Vascular: Normal flow voids. Skull and upper cervical spine: Normal marrow signal. Sinuses/Orbits: Negative. Other: None. IMPRESSION: 10 mm acute/early subacute infarction within the right ventral thalamus. No associated hemorrhage or mass effect. Mild chronic microvascular ischemic changes of the brain for age. These results were called by telephone at the time of interpretation on 12/01/2017 at 10:19 pm to Dr. Joni Fears, who verbally acknowledged these results. Electronically Signed   By: Kristine Garbe M.D.   On: 12/01/2017 22:21   Mr Jodene Nam Head/brain WU Cm  Result Date: 12/02/2017 CLINICAL DATA:  47 y/o  F; headache and left-sided sensory deficit. EXAM: MRA NECK WITHOUT AND WITH CONTRAST MRA HEAD WITHOUT CONTRAST TECHNIQUE: Multiplanar and multiecho pulse sequences of the neck were obtained without and with intravenous contrast. Angiographic images of the neck were obtained using MRA technique without and with intravenous contast.; Angiographic images of the Circle of Willis were obtained using MRA technique without intravenous contrast. CONTRAST:  12mL MULTIHANCE GADOBENATE DIMEGLUMINE 529 MG/ML IV SOLN COMPARISON:  12/01/2017 CT head FINDINGS: MRA NECK FINDINGS Aortic arch: Patent.  Bovine variant branching. Right common carotid artery: Patent. Right internal carotid artery: Patent. Right vertebral artery: Patent. Left common carotid artery: Patent. Left Internal carotid artery: Patent. Left Vertebral artery: Patent. There is no evidence of hemodynamically significant stenosis by NASCET criteria, occlusion, or aneurysm unless noted  above. MRA HEAD FINDINGS Internal carotid arteries:  Patent. Anterior cerebral arteries:  Patent.  Azygos A2. Middle cerebral arteries: Patent. Anterior communicating artery: Patent. Posterior communicating arteries:   Patent. Apparent segments of stenosis of left terminal ICA, bilateral A1, and bilateral M1 segments are are widely patent on the fluid postcontrast MRA of the neck and likely due to motion artifact/sinus susceptibility artifact. Posterior cerebral arteries: Patent. Severe right P2 stenosis also present on the postcontrast MRA of the neck (series 15, image 43). Basilar artery:  Patent. Vertebral arteries:  Patent. No large vessel occlusion or aneurysm identified. IMPRESSION: 1. Patent carotid and vertebral arteries. No dissection, aneurysm, or hemodynamically significant stenosis utilizing NASCET criteria. 2. Patent anterior and posterior intracranial circulation. No large vessel occlusion or aneurysm. 3. 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 Otherwise, no significant stenosis. Electronically Signed   By: Kristine Garbe M.D.   On: 12/02/2017 02:01    Medications: I have reviewed the patient's current medications.  Assessment/Plan: No new neurological complaints.  TEE unable to be performed due to patient cooperation.  Would still like to rule out the possibility of a PFO.    Recommendations: 1.  Echocardiogram with bubble 2.  Continue Plavix and statin with no change indicated if echo unremarkable 3.  Hypercoagulable work up pending and to be followed up on an outpatient basis with neurological follow up.     LOS: 1 day   Alexis Goodell, MD Neurology [phone] 12/03/2017  10:22 AM

## 2017-12-03 NOTE — Discharge Summary (Signed)
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 Hypertension     HOSPITAL COURSE:   47 year old female with history of essential hypertension who presented with headache and dizziness.  1.10 mm acute/early subacute infarction within the right ventral  thalamus. No associated hemorrhage or mass effect. Echocardiogram did not show evidence of embolic disease.  She was evaluated neurology who would recommend TEE.  TEE was performed but it was unsuccessful because she could tolerate the procedure. TTE with bubble was done which did not show PFO. We will continue on P Plavix and statin  LDL 109 A1c 5.4  2.  Accelerated essential hypertension due to CVA She will continue Lisinopril, HCTZ and Norvasc     DISCHARGE CONDITIONS AND DIET:   Stable cardiac diet  CONSULTS OBTAINED:  Treatment Team:  Alexis Goodell, MD [place], MD  DRUG ALLERGIES:   Allergies  Allergen Reactions  . Silicone   . Apricot Flavor Other (See Comments)    Tingling to lips    DISCHARGE MEDICATIONS:   Allergies as of 12/03/2017      Reactions   Silicone    Apricot Flavor Other (See Comments)   Tingling to lips      Medication List    STOP taking these medications   aspirin EC 81 MG tablet   multivitamin with minerals Tabs tablet     TAKE these medications    amLODipine 10 MG tablet Commonly known as:  NORVASC Take 1 tablet (10 mg total) by mouth daily. Start taking on:  12/04/2017   clopidogrel 75 MG tablet Commonly known as:  PLAVIX Take 1 tablet (75 mg total) by mouth daily. Start taking on:  12/04/2017   hydrochlorothiazide 25 MG tablet Commonly known as:  HYDRODIURIL Take 1 tablet (25 mg total) by mouth daily. Start taking on:  12/04/2017   lisinopril 20 MG tablet Commonly known as:  PRINIVIL,ZESTRIL Take 20 mg by mouth daily.   pantoprazole 40 MG tablet Commonly known as:  PROTONIX Take 1 tablet (40 mg total) by mouth daily. Start taking on:  12/04/2017   rosuvastatin 10 MG tablet Commonly known as:  CRESTOR Take 1 tablet (10 mg total) by mouth daily. Start taking on:  12/04/2017         Today   CHIEF COMPLAINT:  Doing better this am BP better   VITAL SIGNS:  Blood pressure (!) 144/107, pulse 70, temperature 98.4 F (36.9 C), temperature source Oral, resp. rate 20, height 5\' 4"  (1.626 m), weight 85.7 kg (188 lb 14.4 oz), last menstrual period 12/01/2017, SpO2 100 %.   REVIEW OF SYSTEMS:  Review of Systems  Constitutional: Negative.  Negative for chills, fever and malaise/fatigue.  HENT: Negative.  Negative for ear discharge, ear pain, hearing loss, nosebleeds and sore throat.   Eyes: Negative.  Negative for blurred vision and pain.  Respiratory: Negative.  Negative for cough, hemoptysis, shortness of breath and  wheezing.   Cardiovascular: Negative.  Negative for chest pain, palpitations and leg swelling.  Gastrointestinal: Negative.  Negative for abdominal pain, blood in stool, diarrhea, nausea and vomiting.  Genitourinary: Negative.  Negative for dysuria.  Musculoskeletal: Negative.  Negative for back pain.  Skin: Negative.   Neurological: Negative for dizziness, tremors, speech change, focal weakness, seizures and headaches.  Endo/Heme/Allergies: Negative.  Does not bruise/bleed easily.   Psychiatric/Behavioral: Negative.  Negative for depression, hallucinations and suicidal ideas.     PHYSICAL EXAMINATION:  GENERAL:  47 y.o.-year-old patient lying in the bed with no acute distress.  NECK:  Supple, no jugular venous distention. No thyroid enlargement, no tenderness.  LUNGS: Normal breath sounds bilaterally, no wheezing, rales,rhonchi  No use of accessory muscles of respiration.  CARDIOVASCULAR: S1, S2 normal. No murmurs, rubs, or gallops.  ABDOMEN: Soft, non-tender, non-distended. Bowel sounds present. No organomegaly or mass.  EXTREMITIES: No pedal edema, cyanosis, or clubbing.  PSYCHIATRIC: The patient is alert and oriented x 3.  SKIN: No obvious rash, lesion, or ulcer.   DATA REVIEW:   CBC Recent Labs  Lab 12/01/17 1739  WBC 3.5*  HGB 12.8  HCT 37.5  PLT 232    Chemistries  Recent Labs  Lab 12/01/17 1739  NA 136  K 3.7  CL 103  CO2 26  GLUCOSE 101*  BUN 11  CREATININE 0.95  CALCIUM 8.9  AST 27  ALT 19  ALKPHOS 63  BILITOT 0.5    Cardiac Enzymes Recent Labs  Lab 12/01/17 1739  TROPONINI <0.03    Microbiology Results  @[id number] @  RADIOLOGY:  Ct Head Wo Contrast  Result Date: 12/01/2017 CLINICAL DATA:  Severe headache dizziness EXAM: CT HEAD WITHOUT CONTRAST TECHNIQUE: Contiguous axial images were obtained from the base of the skull through the vertex without intravenous contrast. COMPARISON:  None. FINDINGS: Brain: No evidence of acute infarction, hemorrhage, hydrocephalus, extra-axial collection or mass lesion/mass effect. Vascular: Negative for hyperdense vessel Skull: Negative Sinuses/Orbits: Negative Other: None IMPRESSION: Normal CT head Electronically Signed   By: Franchot Gallo M.D.   On: 12/01/2017 18:28   Mr Jodene Nam Neck W Wo Contrast  Result Date: 12/02/2017 CLINICAL DATA:  47 y/o  F; headache and left-sided sensory deficit. EXAM: MRA NECK WITHOUT AND WITH CONTRAST MRA HEAD WITHOUT CONTRAST TECHNIQUE: Multiplanar and multiecho  pulse sequences of the neck were obtained without and with intravenous contrast. Angiographic images of the neck were obtained using MRA technique without and with intravenous contast.; Angiographic images of the Circle of Willis were obtained using MRA technique without intravenous contrast. CONTRAST:  74mL MULTIHANCE GADOBENATE DIMEGLUMINE 529 MG/ML IV SOLN COMPARISON:  12/01/2017 CT head FINDINGS: MRA NECK FINDINGS Aortic arch: Patent.  Bovine variant branching. Right common carotid artery: Patent. Right internal carotid artery: Patent. Right vertebral artery: Patent. Left common carotid artery: Patent. Left Internal carotid artery: Patent. Left Vertebral artery: Patent. There is no evidence of hemodynamically significant stenosis by NASCET criteria, occlusion, or aneurysm unless noted above. MRA HEAD FINDINGS Internal carotid arteries:  Patent. Anterior cerebral arteries:  Patent.  Azygos A2. Middle cerebral arteries: Patent. Anterior communicating artery: Patent. Posterior communicating arteries:  Patent. Apparent segments of stenosis of left terminal ICA, bilateral A1, and bilateral M1 segments are are widely patent on the fluid postcontrast MRA of the neck and likely due to motion artifact/sinus susceptibility artifact. Posterior cerebral arteries: Patent. Severe right P2 stenosis also present on the postcontrast MRA of the neck (series 15, image 43). Basilar artery:  Patent.  Vertebral arteries:  Patent. No large vessel occlusion or aneurysm identified. IMPRESSION: 1. Patent carotid and vertebral arteries. No dissection, aneurysm, or hemodynamically significant stenosis utilizing NASCET criteria. 2. Patent anterior and posterior intracranial circulation. No large vessel occlusion or aneurysm. 3. 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 Otherwise, no significant stenosis. Electronically Signed   By: Kristine Garbe M.D.   On: 12/02/2017 02:01   Mr Brain Wo Contrast  Result Date:  12/01/2017 CLINICAL DATA:  47 y/o F; severe gradually worsening headache with numbness of the left arm and left side of lips. History of hypertension. EXAM: MRI HEAD WITHOUT CONTRAST TECHNIQUE: Multiplanar, multiecho pulse sequences of the brain and surrounding structures were obtained without intravenous contrast. COMPARISON:  12/01/2017 head CT. FINDINGS: Brain: 10 mm focus of reduced diffusion within the right ventral thalamus compatible with acute/early subacute infarction. No associated hemorrhage or mass effect. Few additional scattered punctate foci of T2 FLAIR hyperintense signal abnormality in white matter are compatible with mild chronic microvascular ischemic changes for age. No extra-axial collection, hydrocephalus, or effacement of basilar cisterns. Vascular: Normal flow voids. Skull and upper cervical spine: Normal marrow signal. Sinuses/Orbits: Negative. Other: None. IMPRESSION: 10 mm acute/early subacute infarction within the right ventral thalamus. No associated hemorrhage or mass effect. Mild chronic microvascular ischemic changes of the brain for age. These results were called by telephone at the time of interpretation on 12/01/2017 at 10:19 pm to Dr. Joni Fears, who verbally acknowledged these results. Electronically Signed   By: Kristine Garbe M.D.   On: 12/01/2017 22:21   Mr Jodene Nam Head/brain JK Cm  Result Date: 12/02/2017 CLINICAL DATA:  47 y/o  F; headache and left-sided sensory deficit. EXAM: MRA NECK WITHOUT AND WITH CONTRAST MRA HEAD WITHOUT CONTRAST TECHNIQUE: Multiplanar and multiecho pulse sequences of the neck were obtained without and with intravenous contrast. Angiographic images of the neck were obtained using MRA technique without and with intravenous contast.; Angiographic images of the Circle of Willis were obtained using MRA technique without intravenous contrast. CONTRAST:  60mL MULTIHANCE GADOBENATE DIMEGLUMINE 529 MG/ML IV SOLN COMPARISON:  12/01/2017 CT head  FINDINGS: MRA NECK FINDINGS Aortic arch: Patent.  Bovine variant branching. Right common carotid artery: Patent. Right internal carotid artery: Patent. Right vertebral artery: Patent. Left common carotid artery: Patent. Left Internal carotid artery: Patent. Left Vertebral artery: Patent. There is no evidence of hemodynamically significant stenosis by NASCET criteria, occlusion, or aneurysm unless noted above. MRA HEAD FINDINGS Internal carotid arteries:  Patent. Anterior cerebral arteries:  Patent.  Azygos A2. Middle cerebral arteries: Patent. Anterior communicating artery: Patent. Posterior communicating arteries:  Patent. Apparent segments of stenosis of left terminal ICA, bilateral A1, and bilateral M1 segments are are widely patent on the fluid postcontrast MRA of the neck and likely due to motion artifact/sinus susceptibility artifact. Posterior cerebral arteries: Patent. Severe right P2 stenosis also present on the postcontrast MRA of the neck (series 15, image 43). Basilar artery:  Patent. Vertebral arteries:  Patent. No large vessel occlusion or aneurysm identified. IMPRESSION: 1. Patent carotid and vertebral arteries. No dissection, aneurysm, or hemodynamically significant stenosis utilizing NASCET criteria. 2. Patent anterior and posterior intracranial circulation. No large vessel occlusion or aneurysm. 3. 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 Otherwise, no significant stenosis. Electronically Signed   By: Kristine Garbe M.D.   On: 12/02/2017 02:01      Allergies as of 12/03/2017      Reactions   Silicone    Apricot Flavor Other (See  Comments)   Tingling to lips      Medication List    STOP taking these medications   aspirin EC 81 MG tablet   multivitamin with minerals Tabs tablet     TAKE these medications   amLODipine 10 MG tablet Commonly known as:  NORVASC Take 1 tablet (10 mg total) by mouth daily. Start taking on:  12/04/2017   clopidogrel 75 MG  tablet Commonly known as:  PLAVIX Take 1 tablet (75 mg total) by mouth daily. Start taking on:  12/04/2017   hydrochlorothiazide 25 MG tablet Commonly known as:  HYDRODIURIL Take 1 tablet (25 mg total) by mouth daily. Start taking on:  12/04/2017   lisinopril 20 MG tablet Commonly known as:  PRINIVIL,ZESTRIL Take 20 mg by mouth daily.   pantoprazole 40 MG tablet Commonly known as:  PROTONIX Take 1 tablet (40 mg total) by mouth daily. Start taking on:  12/04/2017   rosuvastatin 10 MG tablet Commonly known as:  CRESTOR Take 1 tablet (10 mg total) by mouth daily. Start taking on:  12/04/2017          Management plans discussed with the patient and she is in agreement. Stable for discharge home  Patient should follow up with pcp  CODE STATUS:     Code Status Orders  (From admission, onward)        Start     Ordered   12/01/17 2332  Full code  Continuous     12/01/17 2331    Code Status History    This patient has a current code status but no historical code status.      TOTAL TIME TAKING CARE OF THIS PATIENT: 37 minutes.    Note: This dictation was prepared with Dragon dictation along with smaller phrase technology. Any transcriptional errors that result from this process are unintentional.  Asyah Candler M.D on 12/03/2017 at 11:57 AM  Between 7am to 6pm - Pager - [phone] After 6pm go to [email or web address] - password [email or web address] [facility]  Office  [phone]  CC: Primary care physician; Lorelee Market, MD

## 2017-12-03 NOTE — Progress Notes (Signed)
I attempted doing a TEE. The patient seems to have difficulty even with viscous lidocaine. In spite of heavy sedation,the patient was not cooperative at all with probe advancement.  The procedure was aborted.  If TEE is still needed, it will have to be done with anesthesia.

## 2017-12-03 NOTE — Progress Notes (Signed)
OT Cancellation Note  Patient Details Name: Margaret Gaines MRN: [id number] DOB: 10-01-1970   Cancelled Treatment:    Reason Eval/Treat Not Completed: Patient at procedure or test/ unavailable. Pt out of the room for a procedure. Will re-attempt OT evaluation at later date/time as pt is available and medically appropriate.   Jeni Salles, MPH, MS, OTR/L ascom [phone] 12/03/17, 1:13 PM

## 2017-12-03 NOTE — Progress Notes (Signed)
SLP Cancellation Note  Patient Details Name: Margaret Gaines MRN: [id number] DOB: 18-May-1971   Cancelled treatment:       Reason Eval/Treat Not Completed: SLP screened, no needs identified, will sign off(chart reviewed; consulted NSG then met w/ pt) Again, pt and family denied any speech-language deficits. She denied any swallowing problems w/ food she eats. Pt has questions and concerns re: possible s/s of Reflux-like issues - encouraged her to talk w/ MD re: such and need for management of. Patient reports that the swallowing of pills is so difficult that it's the reason she do not take the pills at home - recommended pt take Pills in puree or in liquid form for easier swallowing. During the TEE, pt unable to swallow the probe. Pt may need to f/u w/ ENT/GI for above issues. This does not appear to be oropharyngeal phase in nature as she tolerates thin liquids and a regular diet.  No further skilled ST services indicated as pt appears at her baseline. Pt agreed. NSG to reconsult if any change in status.     Orinda Kenner, [place], CCC-SLP Elizibeth Breau 12/03/2017, 5:24 PM

## 2017-12-03 NOTE — Progress Notes (Signed)
OT Cancellation Note  Patient Details Name: Margaret Gaines MRN: [id number] DOB: [birth year]   Cancelled Treatment:    Reason Eval/Treat Not Completed: Patient declined, no reason specified;Pain limiting ability to participate. Upon initial attempt to see this am, pt with lights off and blinds closed, on her cell phone. Reports R sided headache. Requests OT come back at later time. Will re-attempt later.   Jeni Salles, MPH, MS, OTR/L ascom [phone] 12/03/17, 9:40 AM

## 2017-12-03 NOTE — Progress Notes (Signed)
*  PRELIMINARY RESULTS* Echocardiogram 2D Echocardiogram has been performed.  Margaret Gaines 12/03/2017, 2:26 PM

## 2017-12-03 NOTE — Sedation Documentation (Signed)
Procedure aborted.  Pt unable to swallow the probe.

## 2017-12-03 NOTE — Progress Notes (Signed)
PT Cancellation Note  Patient Details Name: Margaret Gaines MRN: [id number] DOB: 06-May-1971   Cancelled Treatment:    Reason Eval/Treat Not Completed: Patient at procedure or test/unavailable.  Pt with cardiology.  Will re-attempt later if time allows.   Roxanne Gates, PT, DPT 12/03/2017, 2:54 PM

## 2017-12-03 NOTE — Progress Notes (Signed)
    [facility] has been requested to perform a transesophageal echocardiogram on Margaret Gaines for stroke.  After careful review of history and examination, the risks and benefits of transesophageal echocardiogram have been explained including risks of esophageal damage, perforation (1:10,000 risk), bleeding, pharyngeal hematoma as well as other potential complications associated with conscious sedation including aspiration, arrhythmia, respiratory failure and death. Alternatives to treatment were discussed, questions were answered. Patient is willing to proceed. Of note, she did eat one graham cracker @ ~ 8am.  She has been NPO since.  Discussed with Dr. Fletcher Anon.  She is scheduled for noon today.  Murray Hodgkins, NP  12/03/2017 8:57 AM

## 2017-12-06 LAB — BETA-2-GLYCOPROTEIN I ABS, IGG/M/A

## 2017-12-06 LAB — PROTEIN C, TOTAL: Protein C, Total: 115 % (ref 60–150)

## 2017-12-07 LAB — PROTHROMBIN GENE MUTATION

## 2017-12-08 LAB — CARDIOLIPIN ANTIBODIES, IGG, IGM, IGA: Anticardiolipin IgM: 13 MPL U/mL — ABNORMAL HIGH (ref 0–12)

## 2017-12-09 LAB — FACTOR 5 LEIDEN

## 2017-12-12 ENCOUNTER — Encounter: Payer: Self-pay | Admitting: Emergency Medicine

## 2017-12-12 ENCOUNTER — Other Ambulatory Visit: Payer: Self-pay

## 2017-12-12 ENCOUNTER — Emergency Department
Admission: EM | Admit: 2017-12-12 | Discharge: 2017-12-12 | Disposition: A | Discharge status: Home / Self Care | Payer: BLUE CROSS/BLUE SHIELD | Attending: Emergency Medicine | Admitting: Emergency Medicine

## 2017-12-12 DIAGNOSIS — Z7901 Long term (current) use of anticoagulants: Secondary | ICD-10-CM | POA: Insufficient documentation

## 2017-12-12 DIAGNOSIS — Z8673 Personal history of transient ischemic attack (TIA), and cerebral infarction without residual deficits: Secondary | ICD-10-CM | POA: Diagnosis not present

## 2017-12-12 DIAGNOSIS — I10 Essential (primary) hypertension: Secondary | ICD-10-CM | POA: Insufficient documentation

## 2017-12-12 DIAGNOSIS — Z79899 Other long term (current) drug therapy: Secondary | ICD-10-CM | POA: Diagnosis not present

## 2017-12-12 LAB — COMPREHENSIVE METABOLIC PANEL
ALBUMIN: 4 g/dL (ref 3.5–5.0)
ALK PHOS: 54 U/L (ref 38–126)
ALT: 17 U/L (ref 14–54)
ANION GAP: 5 (ref 5–15)
AST: 27 U/L (ref 15–41)
BILIRUBIN TOTAL: 0.3 mg/dL (ref 0.3–1.2)
BUN: 11 mg/dL (ref 6–20)
CO2: 28 mmol/L (ref 22–32)
Calcium: 9.1 mg/dL (ref 8.9–10.3)
Chloride: 104 mmol/L (ref 101–111)
Creatinine, Ser: 0.84 mg/dL (ref 0.44–1.00)
GFR calc Af Amer: >60 mL/min (ref >60)
GFR calc non Af Amer: >60 mL/min (ref >60)
Glucose, Bld: 100 mg/dL — ABNORMAL HIGH (ref 65–99)
POTASSIUM: 3.8 mmol/L (ref 3.5–5.1)
SODIUM: 137 mmol/L (ref 135–145)
TOTAL PROTEIN: 7.7 g/dL (ref 6.5–8.1)

## 2017-12-12 LAB — CBC WITH DIFFERENTIAL/PLATELET
BASOS PCT: 1 %
Basophils Absolute: 0 10*3/uL (ref 0–0.1)
EOS ABS: 0.1 10*3/uL (ref 0–0.7)
Eosinophils Relative: 3 %
HEMATOCRIT: 37 % (ref 35.0–47.0)
HEMOGLOBIN: 12.4 g/dL (ref 12.0–16.0)
LYMPHS ABS: 1.4 10*3/uL (ref 1.0–3.6)
Lymphocytes Relative: 31 %
MCH: 28.3 pg (ref 26.0–34.0)
MCHC: 33.5 g/dL (ref 32.0–36.0)
MCV: 84.6 fL (ref 80.0–100.0)
MONOS PCT: 5 %
Monocytes Absolute: 0.2 10*3/uL (ref 0.2–0.9)
NEUTROS ABS: 2.7 10*3/uL (ref 1.4–6.5)
NEUTROS PCT: 60 %
Platelets: 225 10*3/uL (ref 150–440)
RBC: 4.37 MIL/uL (ref 3.80–5.20)
RDW: 14.8 % — ABNORMAL HIGH (ref 11.5–14.5)
WBC: 4.5 10*3/uL (ref 3.6–11.0)

## 2017-12-12 NOTE — ED Triage Notes (Signed)
Pt to ED via POV for high blood pressure. Pt states that she was seen last week for same complaint and blood pressure has not came down. Pt seen her PCP and they increased her blood pressure medication but it has not helped. Pt states that today she has been having tingling on her left side. Pt states that the first occurrence was at 1255, pt is unsure how long the tingling lasted but it started back around 1549 and again around  1730. Pt states that she is having tingling in her throat. Pt has no facial droop or neuro deficits noted. Pt states that she has been having headaches.

## 2017-12-12 NOTE — ED Provider Notes (Signed)
[facility] Emergency Department Provider Note   ____________________________________________    I have reviewed the triage vital signs and the nursing notes.   HISTORY  Chief Complaint Hypertension     HPI Margaret Gaines is a 47 y.o. female who presents with complaints of high blood pressure.  Review of medical records demonstrates that the patient was recently in the hospital, diagnosed with right thalamic stroke, her symptoms at that time were tingling in the left upper extremity and face.  She notes that she has been checking her blood pressure and is been significantly better than prior to treatment.  She has had intermittent tingling in the left hand which concerned her so she came to the emergency department.  Admits this has been going on since the stroke.   Past Medical History:  Diagnosis Date  . Hypertension     Patient Active Problem List   Diagnosis Date Noted  . Stroke (cerebrum) ([place]) 12/02/2017  . Stroke ([place]) 12/01/2017  . Hypertension 12/01/2017    Past Surgical History:  Procedure Laterality Date  . CESAREAN SECTION     x2   . TEE WITHOUT CARDIOVERSION N/A 12/03/2017   Procedure: TRANSESOPHAGEAL ECHOCARDIOGRAM (TEE);  Surgeon: Wellington Hampshire, MD;  Location: [facility] ORS;  Service: Cardiovascular;  Laterality: N/A;    Prior to Admission medications   Medication Sig Start Date End Date Taking? Authorizing Provider  amLODipine (NORVASC) 10 MG tablet Take 1 tablet (10 mg total) by mouth daily. 12/04/17   Bettey Costa, MD  clopidogrel (PLAVIX) 75 MG tablet Take 1 tablet (75 mg total) by mouth daily. 12/04/17   Bettey Costa, MD  hydrochlorothiazide (HYDRODIURIL) 25 MG tablet Take 1 tablet (25 mg total) by mouth daily. 12/04/17   Bettey Costa, MD  lisinopril (PRINIVIL,ZESTRIL) 20 MG tablet Take 20 mg by mouth daily.    [provider]  pantoprazole (PROTONIX) 40 MG tablet Take 1 tablet (40 mg total) by mouth daily. 12/04/17    Bettey Costa, MD  rosuvastatin (CRESTOR) 10 MG tablet Take 1 tablet (10 mg total) by mouth daily. 12/04/17   Bettey Costa, MD     Allergies Silicone and Apricot flavor  No family history on file.  Social History Social History   Tobacco Use  . Smoking status: Never Smoker  . Smokeless tobacco: Never Used  Substance Use Topics  . Alcohol use: Yes  . Drug use: Not on file    Review of Systems  Constitutional: No fever/chills Eyes: No visual changes.  ENT: No sore throat. Cardiovascular: Denies chest pain. Respiratory: Denies shortness of breath. Gastrointestinal:   No nausea, no vomiting.   Genitourinary: Negative for dysuria. Musculoskeletal: Negative for back pain. Skin: Negative for rash. Neurological: Intermittent headaches, tingling as above   ____________________________________________   PHYSICAL EXAM:  VITAL SIGNS: ED Triage Vitals  Enc Vitals Group     BP 12/12/17 1757 (!) 144/90     Pulse Rate 12/12/17 1757 61     Resp 12/12/17 1757 18     Temp 12/12/17 1757 98.7 F (37.1 C)     Temp Source 12/12/17 1757 Oral     SpO2 12/12/17 1757 100 %     Weight 12/12/17 1758 86.2 kg (190 lb)     Height 12/12/17 1758 1.626 m (5\' 4" )     Head Circumference --      Peak Flow --      Pain Score 12/12/17 1809 7     Pain  Loc --      Pain Edu? --      Excl. in [place]? --     Constitutional: Alert and oriented. No acute distress. Pleasant and interactive Eyes: Conjunctivae are normal.  PERRLA  Nose: No congestion/rhinnorhea. Mouth/Throat: Mucous membranes are moist.    Cardiovascular: Normal rate, regular rhythm. Grossly normal heart sounds.  Good peripheral circulation. Respiratory: Normal respiratory effort.  No retractions. Lungs CTAB. Gastrointestinal: Soft and nontender. No distention.  No CVA tenderness. Genitourinary: deferred Musculoskeletal: Normal strength in all extremities warm and well perfused Neurologic:  Normal speech and language. No gross focal  neurologic deficits are appreciated.  Cranial nerves II through XII are normal Skin:  Skin is warm, dry and intact. No rash noted. Psychiatric: Mood and affect are normal. Speech and behavior are normal.  ____________________________________________   LABS (all labs ordered are listed, but only abnormal results are displayed)  Labs Reviewed  CBC WITH DIFFERENTIAL/PLATELET - Abnormal; Notable for the following components:      Result Value   RDW 14.8 (*)    All other components within normal limits  COMPREHENSIVE METABOLIC PANEL - Abnormal; Notable for the following components:   Glucose, Bld 100 (*)    All other components within normal limits   ____________________________________________  EKG   ____________________________________________  RADIOLOGY   ____________________________________________   PROCEDURES  Procedure(s) performed: No  Procedures   Critical Care performed:No ____________________________________________   INITIAL IMPRESSION / ASSESSMENT AND PLAN / ED COURSE  Pertinent labs & imaging results that were available during my care of the patient were reviewed by me and considered in my medical decision making (see chart for details).  Patient presents with tingling as described above, patient showed me her blood pressure log and it is much better controlled on current regimen.  Has had intermittent tingling since the stroke, I described to her that the symptoms are likely related to damage to the brain and are unlikely to resolve rapidly may improve over the next month  Strongly encouraged her to continue her current regimen of blood pressure medications and to follow-up closely with her PCP as she has been.    ____________________________________________   FINAL CLINICAL IMPRESSION(S) / ED DIAGNOSES  Final diagnoses:  Essential hypertension        Note:  This document was prepared using Dragon voice recognition software and may include  unintentional dictation errors.    Lavonia Drafts, MD 12/12/17 2007

## 2017-12-12 NOTE — ED Notes (Signed)
ED Provider at bedside. 

## 2017-12-14 ENCOUNTER — Emergency Department: Payer: BLUE CROSS/BLUE SHIELD

## 2017-12-14 ENCOUNTER — Other Ambulatory Visit: Payer: Self-pay

## 2017-12-14 ENCOUNTER — Emergency Department
Admission: EM | Admit: 2017-12-14 | Discharge: 2017-12-14 | Disposition: A | Discharge status: Home / Self Care | Payer: BLUE CROSS/BLUE SHIELD | Attending: Emergency Medicine | Admitting: Emergency Medicine

## 2017-12-14 DIAGNOSIS — I63531 Cerebral infarction due to unspecified occlusion or stenosis of right posterior cerebral artery: Secondary | ICD-10-CM | POA: Diagnosis not present

## 2017-12-14 DIAGNOSIS — Z79899 Other long term (current) drug therapy: Secondary | ICD-10-CM | POA: Diagnosis not present

## 2017-12-14 DIAGNOSIS — I639 Cerebral infarction, unspecified: Secondary | ICD-10-CM | POA: Insufficient documentation

## 2017-12-14 DIAGNOSIS — R200 Anesthesia of skin: Secondary | ICD-10-CM | POA: Diagnosis present

## 2017-12-14 DIAGNOSIS — I10 Essential (primary) hypertension: Secondary | ICD-10-CM | POA: Insufficient documentation

## 2017-12-14 LAB — CBC
HCT: 36.9 % (ref 35.0–47.0)
HEMOGLOBIN: 12.4 g/dL (ref 12.0–16.0)
MCH: 28.4 pg (ref 26.0–34.0)
MCHC: 33.6 g/dL (ref 32.0–36.0)
MCV: 84.3 fL (ref 80.0–100.0)
Platelets: 221 10*3/uL (ref 150–440)
RBC: 4.37 MIL/uL (ref 3.80–5.20)
RDW: 14.7 % — ABNORMAL HIGH (ref 11.5–14.5)
WBC: 5.4 10*3/uL (ref 3.6–11.0)

## 2017-12-14 LAB — BASIC METABOLIC PANEL
Anion gap: 6 (ref 5–15)
BUN: 13 mg/dL (ref 6–20)
CHLORIDE: 105 mmol/L (ref 101–111)
CO2: 26 mmol/L (ref 22–32)
CREATININE: 0.9 mg/dL (ref 0.44–1.00)
Calcium: 8.8 mg/dL — ABNORMAL LOW (ref 8.9–10.3)
GFR calc Af Amer: >60 mL/min (ref >60)
GFR calc non Af Amer: >60 mL/min (ref >60)
GLUCOSE: 105 mg/dL — AB (ref 65–99)
Potassium: 3.7 mmol/L (ref 3.5–5.1)
Sodium: 137 mmol/L (ref 135–145)

## 2017-12-14 LAB — POCT PREGNANCY, URINE: Preg Test, Ur: NEGATIVE

## 2017-12-14 LAB — TROPONIN I

## 2017-12-14 MED ORDER — ASPIRIN 81 MG PO CHEW
324.0000 mg | CHEWABLE_TABLET | Freq: Once | ORAL | Status: AC
  Administered 2017-12-14: 324 mg via ORAL
  Filled 2017-12-14: qty 4

## 2017-12-14 MED ORDER — ASPIRIN EC 81 MG PO TBEC: 81.0000 mg | DELAYED_RELEASE_TABLET | Freq: Every day | ORAL | 2 refills | Status: AC

## 2017-12-14 NOTE — ED Provider Notes (Signed)
[facility] Emergency Department Provider Note   ____________________________________________   First MD Initiated Contact with Patient 12/14/17 [phone]     (approximate)  I have reviewed the triage vital signs and the nursing notes.   HISTORY  Chief Complaint Numbness    HPI Margaret Gaines is a 47 y.o. female reports she had a recent stroke  Patient reports that she been feeling better, when she left the hospital she did not have any ongoing numbness tingling except she had a mild headache which is not improved over the last 2 weeks.  Patient reports about 5 AM yesterday she noted she started having intermittent tingling from the left side of the face left arm and left leg.  She reports she continues to have her mild ongoing headache.  Is described as mild.  She denies any numbness or tingling.  No fevers or chills.  No chest pain or trouble breathing.  She continues on her blood pressure medications, took both this morning as well as her Plavix.  No chest pain.  No trouble breathing.  No trouble walking.  No weakness in the arms or legs.  No facial droop or trouble speaking.   Past Medical History:  Diagnosis Date  . Hypertension     Patient Active Problem List   Diagnosis Date Noted  . Stroke (cerebrum) ([place]) 12/02/2017  . Stroke ([place]) 12/01/2017  . Hypertension 12/01/2017    Past Surgical History:  Procedure Laterality Date  . CESAREAN SECTION     x2   . TEE WITHOUT CARDIOVERSION N/A 12/03/2017   Procedure: TRANSESOPHAGEAL ECHOCARDIOGRAM (TEE);  Surgeon: Wellington Hampshire, MD;  Location: [facility] ORS;  Service: Cardiovascular;  Laterality: N/A;    Prior to Admission medications   Medication Sig Start Date End Date Taking? Authorizing Provider  amLODipine (NORVASC) 10 MG tablet Take 1 tablet (10 mg total) by mouth daily. 12/04/17  Yes Mody, Ulice Bold, MD  atorvastatin (LIPITOR) 20 MG tablet Take 20 mg by mouth at bedtime. 12/08/17  Yes [provider]  clopidogrel (PLAVIX) 75 MG tablet Take 1 tablet (75 mg total) by mouth daily. 12/04/17  Yes Mody, Ulice Bold, MD  hydrochlorothiazide (HYDRODIURIL) 25 MG tablet Take 1 tablet (25 mg total) by mouth daily. 12/04/17  Yes Mody, Ulice Bold, MD  ibuprofen (ADVIL,MOTRIN) 200 MG tablet Take 400 mg by mouth every 6 (six) hours as needed for headache.   Yes [provider]  lisinopril (PRINIVIL,ZESTRIL) 40 MG tablet Take 40 mg by mouth daily. 12/07/17  Yes [provider]  aspirin EC 81 MG tablet Take 1 tablet (81 mg total) by mouth daily. 12/14/17 12/14/18  Delman Kitten, MD  pantoprazole (PROTONIX) 40 MG tablet Take 1 tablet (40 mg total) by mouth daily. Patient not taking: Reported on 12/14/2017 12/04/17   Bettey Costa, MD  rosuvastatin (CRESTOR) 10 MG tablet Take 1 tablet (10 mg total) by mouth daily. Patient not taking: Reported on 12/14/2017 12/04/17   Bettey Costa, MD    Allergies Silicone and Apricot flavor  No family history on file.  Social History Social History   Tobacco Use  . Smoking status: Never Smoker  . Smokeless tobacco: Never Used  Substance Use Topics  . Alcohol use: Yes  . Drug use: Not on file    Review of Systems Constitutional: No fever/chills Eyes: No visual changes.  ENT: No sore throat. Cardiovascular: Denies chest pain. Respiratory: Denies shortness of breath. Gastrointestinal: No abdominal pain.  No nausea, no vomiting.  No diarrhea.  No constipation. Genitourinary: Negative for dysuria. Musculoskeletal: Negative for back pain.   Skin: Negative for rash. Neurological: Negative for focal weakness does does report some numbness her left face left arm and left leg.  No weakness in the arms or legs.  No trouble speaking.  No dense sensory loss, does report it just a decreased slight change in sensation left face left arm left leg.   ____________________________________________   PHYSICAL EXAM:  VITAL SIGNS: ED Triage Vitals  Enc Vitals Group      BP 12/14/17 0744 (!) 163/99     Pulse Rate 12/14/17 0744 (!) 55     Resp 12/14/17 0744 16     Temp 12/14/17 0744 98.7 F (37.1 C)     Temp Source 12/14/17 0744 Oral     SpO2 12/14/17 0744 100 %     Weight 12/14/17 0745 190 lb (86.2 kg)     Height 12/14/17 0745 5\' 4"  (1.626 m)     Head Circumference --      Peak Flow --      Pain Score 12/14/17 0744 0     Pain Loc --      Pain Edu? --      Excl. in [place]? --     Constitutional: Alert and oriented. Well appearing and in no acute distress. Eyes: Conjunctivae are normal. Head: Atraumatic. Nose: No congestion/rhinnorhea. Mouth/Throat: Mucous membranes are moist. Neck: No stridor.   Cardiovascular: Normal rate, regular rhythm. Grossly normal heart sounds.  Good peripheral circulation. Respiratory: Normal respiratory effort.  No retractions. Lungs CTAB. Gastrointestinal: Soft and nontender. No distention. Musculoskeletal: No lower extremity tenderness nor edema. Neurologic:  Normal speech and language. No gross focal neurologic deficits are appreciated for slight decrease in sensation left arm left face left leg.  Cranial nerve exam is normal, no acute dense sensory deficits.  Normal symmetric smile.  Clear speech.  5 out of 5 strength in all extremities. Skin:  Skin is warm, dry and intact. No rash noted. Psychiatric: Mood and affect are normal. Speech and behavior are normal.  ____________________________________________   LABS (all labs ordered are listed, but only abnormal results are displayed)  Labs Reviewed  CBC - Abnormal; Notable for the following components:      Result Value   RDW 14.7 (*)    All other components within normal limits  BASIC METABOLIC PANEL - Abnormal; Notable for the following components:   Glucose, Bld 105 (*)    Calcium 8.8 (*)    All other components within normal limits  TROPONIN I  POCT PREGNANCY, URINE  POC URINE PREG, ED    ____________________________________________  EKG   ____________________________________________  RADIOLOGY  MRI of the brain result reviewed, findings as noted below  IMPRESSION: 1. New acute punctate infarcts involving the lateral right thalamus and posterior limb right internal capsule. 2. Progression of previously noted right thalamic infarcts without evidence for hemorrhage. 3. No other focal ischemic or white matter disease.____________________________________________   PROCEDURES  Procedure(s) performed: None  Procedures  Critical Care performed: No  ____________________________________________   INITIAL IMPRESSION / ASSESSMENT AND PLAN / ED COURSE  Pertinent labs & imaging results that were available during my care of the patient were reviewed by me and considered in my medical decision making (see chart for details).  Patient returns for evaluation of paresthesia involving the left face left arm and left leg.  She had a recent ischemic stroke.  She is currently on Plavix therapy.  She has no dense neurologic deficit, no motor weakness, normal cranial nerve exam with exception of slight decrease in sensation of her left face arm and leg.  Have ordered MRI of the brain, neurology consult, basic labs.  No cardiac or pulmonary symptoms.  Currently on antihypertensive therapy at home.    ----------------------------------------- 10:53 AM on 12/14/2017 -----------------------------------------  Patient has been seen and examined, MRI reviewed by Dr. Irish Elders of neurology.  He recommends patient continue on her current Plavix at 75 mg daily as well as add 81 mg aspirin daily.  Appears subacute extension of a previous ischemic stroke.  He advises, no indication for hospitalization or further ED work-up at this time, will continue the patient on dual antiplatelet therapy.  Patient will continue to follow-up closely with neurology, Dr. Brigitte Pulse.  Return precautions and  treatment recommendations and follow-up discussed with the patient who is agreeable with the plan.   ____________________________________________   FINAL CLINICAL IMPRESSION(S) / ED DIAGNOSES  Final diagnoses:  Ischemic stroke ([place])      NEW MEDICATIONS STARTED DURING THIS VISIT:  New Prescriptions   ASPIRIN EC 81 MG TABLET    Take 1 tablet (81 mg total) by mouth daily.     Note:  This document was prepared using Dragon voice recognition software and may include unintentional dictation errors.     Delman Kitten, MD 12/14/17 1057

## 2017-12-14 NOTE — Consult Note (Signed)
Referring Physician: Dr. Jacqualine Code     Chief Complaint: L sided numbness   HPI: Margaret Gaines is an 47 y.o. female  With hx of HTH, HA presented to [facility] about a week - 10 days ago with headache and was found to have R thalamic infarct. Pt now presents with with L sided numbness that comes and goes.  Currently resolved.  MRI likely extension of prior stroke on R BG area.  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   Date last known well: Date: 12/14/2017 Time last known well: unknown  tPA Given: No: not in window  Past Medical History:  Diagnosis Date  . Hypertension     Past Surgical History:  Procedure Laterality Date  . CESAREAN SECTION     x2   . TEE WITHOUT CARDIOVERSION N/A 12/03/2017   Procedure: TRANSESOPHAGEAL ECHOCARDIOGRAM (TEE);  Surgeon: Wellington Hampshire, MD;  Location: [facility] ORS;  Service: Cardiovascular;  Laterality: N/A;    No family history on file. Social History:  reports that she has never smoked. She has never used smokeless tobacco. She reports that she drinks alcohol. Her drug history is not on file.  Allergies:  Allergies  Allergen Reactions  . Silicone   . Apricot Flavor Other (See Comments)    Tingling to lips    Medications: I have reviewed the patient's current medications.  ROS: History obtained from the patient  General ROS: negative for - chills, fatigue, fever, night sweats, weight gain or weight loss Psychological ROS: negative for - behavioral disorder, hallucinations, memory difficulties, mood swings or suicidal ideation Ophthalmic ROS: negative for - blurry vision, double vision, eye pain or loss of vision ENT ROS: negative for - epistaxis, nasal discharge, oral lesions, sore throat, tinnitus or vertigo Allergy and Immunology ROS: negative for - hives or itchy/watery eyes Hematological and Lymphatic ROS: negative for - bleeding problems, bruising or swollen lymph nodes Endocrine ROS: negative for - galactorrhea, hair pattern changes,  polydipsia/polyuria or temperature intolerance Respiratory ROS: negative for - cough, hemoptysis, shortness of breath or wheezing Cardiovascular ROS: negative for - chest pain, dyspnea on exertion, edema or irregular heartbeat Gastrointestinal ROS: negative for - abdominal pain, diarrhea, hematemesis, nausea/vomiting or stool incontinence Genito-Urinary ROS: negative for - dysuria, hematuria, incontinence or urinary frequency/urgency Musculoskeletal ROS: negative for - joint swelling or muscular weakness Neurological ROS: as noted in HPI Dermatological ROS: negative for rash and skin lesion changes  Physical Examination: Blood pressure (!) 163/99, pulse (!) 55, temperature 98.7 F (37.1 C), temperature source Oral, resp. rate 16, height 5\' 4"  (1.626 m), weight 190 lb (86.2 kg), last menstrual period 12/01/2017, SpO2 100 %.   Neurological Examination   Mental Status: Alert, oriented, thought content appropriate.  Speech fluent without evidence of aphasia.  Able to follow 3 step commands without difficulty. Cranial Nerves: II: Discs flat bilaterally; Visual fields grossly normal, pupils equal, round, reactive to light and accommodation III,IV, VI: ptosis not present, extra-ocular motions intact bilaterally V,VII: smile symmetric, facial light touch sensation normal bilaterally VIII: hearing normal bilaterally IX,X: gag reflex present XI: bilateral shoulder shrug XII: midline tongue extension Motor: Right : Upper extremity   5/5    Left:     Upper extremity   5/5  Lower extremity   5/5     Lower extremity   5/5 Tone and bulk:normal tone throughout; no atrophy noted Sensory: L sided subjective numbness.  Deep Tendon Reflexes: 1+ and symmetric throughout Plantars: Right: downgoing  Left: downgoing Cerebellar: normal finger-to-nose, normal rapid alternating movements and normal heel-to-shin test Gait: not tested      Laboratory Studies:  Basic Metabolic Panel: Recent Labs  Lab  12/12/17 1824 12/14/17 0822  NA 137 137  K 3.8 3.7  CL 104 105  CO2 28 26  GLUCOSE 100* 105*  BUN 11 13  CREATININE 0.84 0.90  CALCIUM 9.1 8.8*    Liver Function Tests: Recent Labs  Lab 12/12/17 1824  AST 27  ALT 17  ALKPHOS 54  BILITOT 0.3  PROT 7.7  ALBUMIN 4.0   No results for input(s): LIPASE, AMYLASE in the last 168 hours. No results for input(s): AMMONIA in the last 168 hours.  CBC: Recent Labs  Lab 12/12/17 1824 12/14/17 0822  WBC 4.5 5.4  NEUTROABS 2.7  --   HGB 12.4 12.4  HCT 37.0 36.9  MCV 84.6 84.3  PLT 225 221    Cardiac Enzymes: Recent Labs  Lab 12/14/17 0822  TROPONINI <0.03    BNP: Invalid input(s): POCBNP  CBG: No results for input(s): GLUCAP in the last 168 hours.  Microbiology: No results found for this or any previous visit.  Coagulation Studies: No results for input(s): LABPROT, INR in the last 72 hours.  Urinalysis: No results for input(s): COLORURINE, LABSPEC, PHURINE, GLUCOSEU, HGBUR, BILIRUBINUR, KETONESUR, PROTEINUR, UROBILINOGEN, NITRITE, LEUKOCYTESUR in the last 168 hours.  Invalid input(s): APPERANCEUR  Lipid Panel:    Component Value Date/Time   CHOL 172 12/02/2017 0526   TRIG 87 12/02/2017 0526   HDL 46 12/02/2017 0526   CHOLHDL 3.7 12/02/2017 0526   VLDL 17 12/02/2017 0526   LDLCALC 109 (H) 12/02/2017 0526    HgbA1C:  Lab Results  Component Value Date   HGBA1C 5.4 12/02/2017    Urine Drug Screen:  No results found for: LABOPIA, COCAINSCRNUR, LABBENZ, AMPHETMU, THCU, LABBARB  Alcohol Level: No results for input(s): ETH in the last 168 hours.  Other results: EKG: normal EKG, normal sinus rhythm, unchanged from previous tracings.  Imaging: Mr Brain Wo Contrast  Result Date: 12/14/2017 CLINICAL DATA:  Episodes of numbness and tingling along the left side of the body. Recent thalamic infarcts. EXAM: MRI HEAD WITHOUT CONTRAST TECHNIQUE: Multiplanar, multiecho pulse sequences of the brain and surrounding  structures were obtained without intravenous contrast. COMPARISON:  MRI brain and MRA head 12/01/2017 FINDINGS: Brain: The diffusion-weighted images demonstrate 2 punctate acute infarcts involving the lateral right thalamus and right internal capsule, new from the prior exam. These are slightly lateral to the previous infarcts. No C8 hemorrhage or mass lesion is present. T2 changes are associated with the more remote infarcts. No other significant white matter disease is present. Ventricles are of normal size. No significant extra-axial fluid collection is present. Vascular: Flow is present in the major intracranial arteries. Skull and upper cervical spine: Skull base is within normal limits. Sinuses/Orbits: Mild mucosal thickening is present in the maxillary sinuses and ethmoid air cells bilaterally. The remaining paranasal sinuses and the mastoid air cells are clear. IMPRESSION: 1. New acute punctate infarcts involving the lateral right thalamus and posterior limb right internal capsule. 2. Progression of previously noted right thalamic infarcts without evidence for hemorrhage. 3. No other focal ischemic or white matter disease. Electronically Signed   By: San Morelle M.D.   On: 12/14/2017 10:32    Assessment: 47 y.o. female With hx of HTH, HA presented to [facility] about a week - 10 days ago with headache and was found to have  R thalamic infarct. Pt now presents with with L sided numbness that comes and goes.  Currently resolved.  MRI likely extension of prior stroke on R BG area.  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   - Pt is not compliant with ASA at home  - Needs to continue ASA 81mg  and Plavix 75 daily  - Recent work up complete  - d/c planning

## 2017-12-14 NOTE — ED Triage Notes (Signed)
Patient complaining of left sided weakness.  States she has "attacks" and was seen here Sat. For same.  Alert and oriented.  Speech is clear.  Smile is symmetrical, able to raise eyebrows, and hold arms in the air for greater than 5 seconds.

## 2017-12-14 NOTE — ED Notes (Signed)
Dr Jacqualine Code in room to examine pt prior to nurse arriving to room - Dr Jacqualine Code ordered MRI - Secretary notified to call MRI

## 2017-12-14 NOTE — ED Notes (Signed)
Patient transported to MRI 

## 2017-12-14 NOTE — ED Notes (Signed)
Triage completed by Leory Plowman since [phone].

## 2017-12-14 NOTE — ED Notes (Signed)
Pt reports that she is ready to go home at this time. Pt had small stroke last week and not taking ASA triggered her symptoms and numbness. Pt understands that she needs to take an ASA daily. Pt husband at bedside. VSS.

## 2017-12-14 NOTE — ED Notes (Signed)
Thought urine had already been sent to lab and Dr Jacqualine Code placed order for hcg urine - urine had not been sent and POC urine preg was done in ED - Lab notified not to run urine and credited account and urine POC reordered

## 2017-12-14 NOTE — ED Notes (Signed)
First Nurse Note:  Patient complaining of numbness "all down my left side".  Patient and husband state they were seen here on Sat. For same and it is getting worse.  Patient alert and oriented.  Sitting in [place].

## 2017-12-14 NOTE — ED Notes (Signed)
Neurologist at bedside. 

## 2017-12-14 NOTE — ED Notes (Signed)
Pt reports that she has been having issue with HTN for the last two days - she reports having "numbness episode" x7-8 yesterday - for the last two days she reports having vision problems in the left eye and numbness/tingling to the entire left side of her body - today she states the thing that is different is that her back is hurting - pt not noted to have facial droop - she uses left arm when not prompted to but if ask to perform a task she refuses to use her left arm

## 2017-12-24 DIAGNOSIS — I639 Cerebral infarction, unspecified: Secondary | ICD-10-CM | POA: Insufficient documentation

## 2019-06-29 ENCOUNTER — Telehealth: Payer: Self-pay | Admitting: Obstetrics & Gynecology

## 2019-06-29 NOTE — Telephone Encounter (Signed)
[place] family referring for recurring BV. Voicemail box is full unable to leave message

## 2019-07-01 NOTE — Telephone Encounter (Signed)
Voicemail box is full - unable to leave message

## 2019-07-11 ENCOUNTER — Other Ambulatory Visit: Payer: Self-pay | Admitting: Obstetrics & Gynecology

## 2019-07-11 ENCOUNTER — Ambulatory Visit (INDEPENDENT_AMBULATORY_CARE_PROVIDER_SITE_OTHER): Payer: BC Managed Care – PPO | Admitting: Obstetrics & Gynecology

## 2019-07-11 ENCOUNTER — Encounter: Payer: Self-pay | Admitting: Obstetrics & Gynecology

## 2019-07-11 ENCOUNTER — Other Ambulatory Visit: Payer: Self-pay

## 2019-07-11 ENCOUNTER — Telehealth: Payer: Self-pay | Admitting: Obstetrics & Gynecology

## 2019-07-11 ENCOUNTER — Other Ambulatory Visit (HOSPITAL_COMMUNITY)
Admission: RE | Admit: 2019-07-11 | Discharge: 2019-07-11 | Disposition: A | Discharge status: Home / Self Care | Payer: BC Managed Care – PPO | Source: Ambulatory Visit | Attending: Obstetrics & Gynecology | Admitting: Obstetrics & Gynecology

## 2019-07-11 VITALS — BP 130/90 | Temp 98.6°F | Ht 66.0 in | Wt 204.0 lb

## 2019-07-11 DIAGNOSIS — N760 Acute vaginitis: Secondary | ICD-10-CM | POA: Diagnosis not present

## 2019-07-11 DIAGNOSIS — Z124 Encounter for screening for malignant neoplasm of cervix: Secondary | ICD-10-CM | POA: Diagnosis not present

## 2019-07-11 DIAGNOSIS — B9689 Other specified bacterial agents as the cause of diseases classified elsewhere: Secondary | ICD-10-CM

## 2019-07-11 MED ORDER — METRONIDAZOLE 500 MG PO TABS: ORAL_TABLET | ORAL | 0 refills | Status: DC

## 2019-07-11 MED ORDER — BORIC ACID CRYS: 600.0000 mg | CRYSTALS | 5 refills | Status: DC

## 2019-07-11 NOTE — Patient Instructions (Addendum)
Vaginitis  Plan to take Boric Acid vaginally twice weekly (this is over the  counter now)  Also plan to take one pill of Flagyl after each trigger (sex)  Vaginitis is a condition in which the vaginal tissue swells and becomes red (inflamed). This condition is most often caused by a change in the normal balance of bacteria and yeast that live in the vagina. This change causes an overgrowth of certain bacteria or yeast, which causes the inflammation. There are different types of vaginitis, but the most common types are:  Bacterial vaginosis.  Yeast infection (candidiasis).  Trichomoniasis vaginitis. This is a sexually transmitted disease (STD).  Viral vaginitis.  Atrophic vaginitis.  Allergic vaginitis. What are the causes? The cause of this condition depends on the type of vaginitis. It can be caused by:  Bacteria (bacterial vaginosis).  Yeast, which is a fungus (yeast infection).  A parasite (trichomoniasis vaginitis).  A virus (viral vaginitis).  Low hormone levels (atrophic vaginitis). Low hormone levels can occur during pregnancy, breastfeeding, or after menopause.  Irritants, such as bubble baths, scented tampons, and feminine sprays (allergic vaginitis). Other factors can change the normal balance of the yeast and bacteria that live in the vagina. These include:  Antibiotic medicines.  Poor hygiene.  Diaphragms, vaginal sponges, spermicides, birth control pills, and intrauterine devices (IUD).  Sex.  Infection.  Uncontrolled diabetes.  A weakened defense (immune) system. What increases the risk? This condition is more likely to develop in women who:  Smoke.  Use vaginal douches, scented tampons, or scented sanitary pads.  Wear tight-fitting pants.  Wear thong underwear.  Use oral birth control pills or an IUD.  Have sex without a condom.  Have multiple sex partners.  Have an STD.  Frequently use the spermicide nonoxynol-9.  Eat lots of foods  high in sugar.  Have uncontrolled diabetes.  Have low estrogen levels.  Have a weakened immune system from an immune disorder or medical treatment.  Are pregnant or breastfeeding. What are the signs or symptoms? Symptoms vary depending on the cause of the vaginitis. Common symptoms include:  Abnormal vaginal discharge. ? The discharge is white, gray, or yellow with bacterial vaginosis. ? The discharge is thick, white, and cheesy with a yeast infection. ? The discharge is frothy and yellow or greenish with trichomoniasis.  A bad vaginal smell. The smell is fishy with bacterial vaginosis.  Vaginal itching, pain, or swelling.  Sex that is painful.  Pain or burning when urinating. Sometimes there are no symptoms. How is this diagnosed? This condition is diagnosed based on your symptoms and medical history. A physical exam, including a pelvic exam, will also be done. You may also have other tests, including:  Tests to determine the pH level (acidity or alkalinity) of your vagina.  A whiff test, to assess the odor that results when a sample of your vaginal discharge is mixed with a potassium hydroxide solution.  Tests of vaginal fluid. A sample will be examined under a microscope. How is this treated? Treatment varies depending on the type of vaginitis you have. Your treatment may include:  Antibiotic creams or pills to treat bacterial vaginosis and trichomoniasis.  Antifungal medicines, such as vaginal creams or suppositories, to treat a yeast infection.  Medicine to ease discomfort if you have viral vaginitis. Your sexual partner should also be treated.  Estrogen delivered in a cream, pill, suppository, or vaginal ring to treat atrophic vaginitis. If vaginal dryness occurs, lubricants and moisturizing creams may help. You  may need to avoid scented soaps, sprays, or douches.  Stopping use of a product that is causing allergic vaginitis. Then using a vaginal cream to treat the  symptoms. Follow these instructions at home: Lifestyle  Keep your genital area clean and dry. Avoid soap, and only rinse the area with water.  Do not douche or use tampons until your health care provider says it is okay to do so. Use sanitary pads, if needed.  Do not have sex until your health care provider approves. When you can return to sex, practice safe sex and use condoms.  Wipe from front to back. This avoids the spread of bacteria from the rectum to the vagina. General instructions  Take over-the-counter and prescription medicines only as told by your health care provider.  If you were prescribed an antibiotic medicine, take or use it as told by your health care provider. Do not stop taking or using the antibiotic even if you start to feel better.  Keep all follow-up visits as told by your health care provider. This is important. How is this prevented?  Use mild, non-scented products. Do not use things that can irritate the vagina, such as fabric softeners. Avoid the following products if they are scented: ? Feminine sprays. ? Detergents. ? Tampons. ? Feminine hygiene products. ? Soaps or bubble baths.  Let air reach your genital area. ? Wear cotton underwear to reduce moisture buildup. ? Avoid wearing underwear while you sleep. ? Avoid wearing tight pants and underwear or nylons without a cotton panel. ? Avoid wearing thong underwear.  Take off any wet clothing, such as bathing suits, as soon as possible.  Practice safe sex and use condoms. Contact a health care provider if:  You have abdominal pain.  You have a fever.  You have symptoms that last for more than 2-3 days. Get help right away if:  You have a fever and your symptoms suddenly get worse. Summary  Vaginitis is a condition in which the vaginal tissue becomes inflamed.This condition is most often caused by a change in the normal balance of bacteria and yeast that live in the vagina.  Treatment  varies depending on the type of vaginitis you have.  Do not douche, use tampons , or have sex until your health care provider approves. When you can return to sex, practice safe sex and use condoms. This information is not intended to replace advice given to you by your health care provider. Make sure you discuss any questions you have with your health care provider. Document Released: 05/04/2007 Document Revised: 06/19/2017 Document Reviewed: 08/12/2016 Elsevier Patient Education  2020 [employer].

## 2019-07-11 NOTE — Telephone Encounter (Signed)
Pt aware.

## 2019-07-11 NOTE — Telephone Encounter (Signed)
Ive added the pharmacy, but I cant send the rx for some reason. Can you send to [facility]

## 2019-07-11 NOTE — Progress Notes (Signed)
HPI:      [name] is a 48 y.o. [id number] who LMP was Patient's last menstrual period was 07/03/2019., presents today for a problem visit.  She complains of:  Vaginitis: Patient complains that she has been having frequent vaginal infections, treated as BV by PCP on several occasions.  She c/o abnormal vaginal discharge for a few days. Vaginal symptoms include local irritation and odor.Vulvar symptoms include none.STI Risk: Very low risk of STD exposureDischarge described as: white and malodorous.Other associated symptoms: none.Menstrual pattern: She had been bleeding regularly. Contraception: none  PMHx: She  has a past medical history of Hypertension and TIA (transient ischemic attack). Also,  has a past surgical history that includes Cesarean section and TEE without cardioversion (N/A, 12/03/2017)., family history is not on file.,  reports that she has never smoked. She has never used smokeless tobacco. She reports current alcohol use. She reports that she does not use drugs.  She has a current medication list which includes the following prescription(s): amlodipine, atorvastatin, clopidogrel, hydrochlorothiazide, ibuprofen, lisinopril, pantoprazole, rosuvastatin, and metronidazole. Also, is allergic to silicone and apricot flavor.  Review of Systems  Constitutional: Negative for chills, fever and malaise/fatigue.  HENT: Negative for congestion, sinus pain and sore throat.   Eyes: Negative for blurred vision and pain.  Respiratory: Negative for cough and wheezing.   Cardiovascular: Negative for chest pain and leg swelling.  Gastrointestinal: Negative for abdominal pain, constipation, diarrhea, heartburn, nausea and vomiting.  Genitourinary: Negative for dysuria, frequency, hematuria and urgency.  Musculoskeletal: Positive for joint pain. Negative for back pain, myalgias and neck pain.  Skin: Negative for itching and rash.  Neurological: Negative for dizziness, tremors and weakness.    Endo/Heme/Allergies: Does not bruise/bleed easily.  Psychiatric/Behavioral: Negative for depression. The patient is not nervous/anxious and does not have insomnia.     Objective: BP 130/90   Temp 98.6 F (37 C)   Ht 5\' 6"  (1.676 m)   Wt 204 lb (92.5 kg)   LMP 07/03/2019   BMI 32.93 kg/m  Physical Exam Constitutional:      General: She is not in acute distress.    Appearance: She is well-developed.  Genitourinary:     Pelvic exam was performed with patient supine.     Vagina and uterus normal.     No vaginal erythema or bleeding.     No cervical motion tenderness, discharge, polyp or nabothian cyst.     Uterus is mobile.     Uterus is not enlarged.     No uterine mass detected.    Uterus is midaxial.     No right or left adnexal mass present.     Right adnexa not tender.     Left adnexa not tender.  HENT:     Head: Normocephalic and atraumatic.     Nose: Nose normal.  Abdominal:     General: There is no distension.     Palpations: Abdomen is soft.     Tenderness: There is no abdominal tenderness.  Musculoskeletal:        General: Normal range of motion.  Neurological:     Mental Status: She is alert and oriented to person, place, and time.     Cranial Nerves: No cranial nerve deficit.  Skin:    General: Skin is warm and dry.  Psychiatric:        Attention and Perception: Attention normal.        Mood and Affect: Mood and affect normal.  Speech: Speech normal.        Behavior: Behavior normal.        Thought Content: Thought content normal.        Judgment: Judgment normal.     WET PREP:   positive clue cells Findings are consistent with bacterial vaginosis.   ASSESSMENT/PLAN:    Problem List Items Addressed This Visit      Genitourinary   Recurrent vaginitis   Relevant Orders   Cervicovaginal ancillary only Boric Acid twice weekly per vagina as prevention    Other Visit Diagnoses    Bacterial vaginosis    -  Primary   Relevant Medications    metroNIDAZOLE (FLAGYL) 500 MG tablet Take after trigger, in her case sex   Other Relevant Orders   Cervicovaginal ancillary only    Assess for alternative etiology due to recurrent nature   Screening for cervical cancer       Relevant Orders   Cytology - PAP      Barnett Applebaum, MD, [facility], [facility] 07/11/2019  2:03 PM

## 2019-07-11 NOTE — Telephone Encounter (Signed)
Let her know all out patients get this ober the counter.  That being said, will send Rx and see how [facility] responds.

## 2019-07-11 NOTE — Telephone Encounter (Signed)
Patient is calling about needing you to send in for prescription for Boric acids. Patient states it is not over the counter. Please advise [facility] [phone] patient would like a call please

## 2019-07-13 LAB — CYTOLOGY - PAP
Comment: NEGATIVE
Diagnosis: NEGATIVE
High risk HPV: NEGATIVE

## 2019-07-13 LAB — CERVICOVAGINAL ANCILLARY ONLY
Bacterial Vaginitis (gardnerella): NEGATIVE
Candida Glabrata: NEGATIVE
Candida Vaginitis: NEGATIVE
Chlamydia: NEGATIVE
Comment: NEGATIVE
Comment: NEGATIVE
Comment: NEGATIVE
Comment: NEGATIVE
Comment: NEGATIVE
Comment: NORMAL
Neisseria Gonorrhea: NEGATIVE
Trichomonas: NEGATIVE

## 2019-10-10 ENCOUNTER — Other Ambulatory Visit: Payer: Self-pay | Admitting: Family Medicine

## 2019-10-10 DIAGNOSIS — Z1231 Encounter for screening mammogram for malignant neoplasm of breast: Secondary | ICD-10-CM

## 2019-10-13 ENCOUNTER — Telehealth: Payer: Self-pay

## 2019-10-13 NOTE — Telephone Encounter (Signed)
Patient requesting a call from [facility] regarding what he last rx'd for her. It doesn't look like it's working. She would like to either do that process again or go another route. Cb#[phone]

## 2019-10-13 NOTE — Telephone Encounter (Signed)
Spoke w/patient to clarify she is referring to the Boric Acid Suppositories. She verified that is what she is referring to. She was seen by her PCP on Monday and still has BV.

## 2019-10-14 ENCOUNTER — Other Ambulatory Visit: Payer: Self-pay | Admitting: Obstetrics & Gynecology

## 2019-10-14 DIAGNOSIS — N760 Acute vaginitis: Secondary | ICD-10-CM

## 2019-10-14 DIAGNOSIS — B9689 Other specified bacterial agents as the cause of diseases classified elsewhere: Secondary | ICD-10-CM

## 2019-10-14 MED ORDER — METRONIDAZOLE 500 MG PO TABS: ORAL_TABLET | ORAL | 3 refills | Status: DC

## 2019-10-14 MED ORDER — METRONIDAZOLE 0.75 % VA GEL: 1.0000 | Freq: Every day | VAGINAL | 0 refills | Status: AC

## 2019-10-14 NOTE — Telephone Encounter (Signed)
Pt aware.

## 2019-10-14 NOTE — Telephone Encounter (Signed)
Have her stop the boric acid; Take the Metrogel for one week; then use the Flagyl pills once weekly for suppression.  ERx done.

## 2020-07-09 ENCOUNTER — Other Ambulatory Visit: Payer: Self-pay

## 2020-07-09 ENCOUNTER — Emergency Department
Admission: EM | Admit: 2020-07-09 | Discharge: 2020-07-09 | Disposition: A | Discharge status: Home / Self Care | Payer: BC Managed Care – PPO | Attending: Emergency Medicine | Admitting: Emergency Medicine

## 2020-07-09 ENCOUNTER — Emergency Department: Payer: BC Managed Care – PPO

## 2020-07-09 ENCOUNTER — Encounter: Payer: Self-pay | Admitting: Emergency Medicine

## 2020-07-09 DIAGNOSIS — Z5321 Procedure and treatment not carried out due to patient leaving prior to being seen by health care provider: Secondary | ICD-10-CM | POA: Insufficient documentation

## 2020-07-09 DIAGNOSIS — K5900 Constipation, unspecified: Secondary | ICD-10-CM | POA: Insufficient documentation

## 2020-07-09 NOTE — ED Triage Notes (Signed)
Patient with complaint of constipation that is causing pressure on her rectum. Patient states that she is unsure of when she had her last BM. Patient states that she has taken fiber and a stool softener with no results.

## 2020-08-26 ENCOUNTER — Encounter: Payer: Self-pay | Admitting: Emergency Medicine

## 2020-08-26 ENCOUNTER — Emergency Department
Admission: EM | Admit: 2020-08-26 | Discharge: 2020-08-26 | Disposition: A | Discharge status: Home / Self Care | Payer: BC Managed Care – PPO | Attending: Emergency Medicine | Admitting: Emergency Medicine

## 2020-08-26 ENCOUNTER — Emergency Department: Payer: BC Managed Care – PPO

## 2020-08-26 ENCOUNTER — Other Ambulatory Visit: Payer: Self-pay

## 2020-08-26 DIAGNOSIS — R1030 Lower abdominal pain, unspecified: Secondary | ICD-10-CM | POA: Diagnosis not present

## 2020-08-26 DIAGNOSIS — I10 Essential (primary) hypertension: Secondary | ICD-10-CM | POA: Diagnosis not present

## 2020-08-26 DIAGNOSIS — Z79899 Other long term (current) drug therapy: Secondary | ICD-10-CM | POA: Insufficient documentation

## 2020-08-26 DIAGNOSIS — K5732 Diverticulitis of large intestine without perforation or abscess without bleeding: Secondary | ICD-10-CM

## 2020-08-26 DIAGNOSIS — R102 Pelvic and perineal pain: Secondary | ICD-10-CM | POA: Diagnosis present

## 2020-08-26 DIAGNOSIS — Z8673 Personal history of transient ischemic attack (TIA), and cerebral infarction without residual deficits: Secondary | ICD-10-CM | POA: Insufficient documentation

## 2020-08-26 LAB — URINALYSIS, COMPLETE (UACMP) WITH MICROSCOPIC
Bacteria, UA: NONE SEEN
Bilirubin Urine: NEGATIVE
Glucose, UA: NEGATIVE mg/dL
Hgb urine dipstick: NEGATIVE
Ketones, ur: NEGATIVE mg/dL
Leukocytes,Ua: NEGATIVE
Nitrite: NEGATIVE
Protein, ur: NEGATIVE mg/dL
Specific Gravity, Urine: 1.014 (ref 1.005–1.030)
pH: 5 (ref 5.0–8.0)

## 2020-08-26 LAB — CBC
HCT: 38.1 % (ref 36.0–46.0)
Hemoglobin: 12.5 g/dL (ref 12.0–15.0)
MCH: 27.5 pg (ref 26.0–34.0)
MCHC: 32.8 g/dL (ref 30.0–36.0)
MCV: 83.9 fL (ref 80.0–100.0)
Platelets: 223 10*3/uL (ref 150–400)
RBC: 4.54 MIL/uL (ref 3.87–5.11)
RDW: 15.1 % (ref 11.5–15.5)
WBC: 8 10*3/uL (ref 4.0–10.5)
nRBC: 0 % (ref 0.0–0.2)

## 2020-08-26 LAB — WET PREP, GENITAL
Clue Cells Wet Prep HPF POC: NONE SEEN
Sperm: NONE SEEN
Trich, Wet Prep: NONE SEEN
Yeast Wet Prep HPF POC: NONE SEEN

## 2020-08-26 LAB — CHLAMYDIA/NGC RT PCR (ARMC ONLY)
Chlamydia Tr: NOT DETECTED
N gonorrhoeae: NOT DETECTED

## 2020-08-26 LAB — COMPREHENSIVE METABOLIC PANEL
ALT: 25 U/L (ref 0–44)
AST: 20 U/L (ref 15–41)
Albumin: 3.8 g/dL (ref 3.5–5.0)
Alkaline Phosphatase: 60 U/L (ref 38–126)
Anion gap: 12 (ref 5–15)
BUN: 11 mg/dL (ref 6–20)
CO2: 22 mmol/L (ref 22–32)
Calcium: 9.2 mg/dL (ref 8.9–10.3)
Chloride: 101 mmol/L (ref 98–111)
Creatinine, Ser: 0.81 mg/dL (ref 0.44–1.00)
GFR, Estimated: >60 mL/min (ref >60)
Glucose, Bld: 109 mg/dL — ABNORMAL HIGH (ref 70–99)
Potassium: 3.5 mmol/L (ref 3.5–5.1)
Sodium: 135 mmol/L (ref 135–145)
Total Bilirubin: 0.7 mg/dL (ref 0.3–1.2)
Total Protein: 6.8 g/dL (ref 6.5–8.1)

## 2020-08-26 LAB — LIPASE, BLOOD: Lipase: 47 U/L (ref 11–51)

## 2020-08-26 LAB — POC URINE PREG, ED: Preg Test, Ur: NEGATIVE

## 2020-08-26 MED ORDER — NAPROXEN 500 MG PO TABS: 500.0000 mg | ORAL_TABLET | Freq: Two times a day (BID) | ORAL | 0 refills | Status: DC

## 2020-08-26 MED ORDER — ONDANSETRON 4 MG PO TBDP: 4.0000 mg | ORAL_TABLET | Freq: Three times a day (TID) | ORAL | 0 refills | Status: AC | PRN

## 2020-08-26 MED ORDER — KETOROLAC TROMETHAMINE 30 MG/ML IJ SOLN
30.0000 mg | Freq: Once | INTRAMUSCULAR | Status: AC
  Administered 2020-08-26: 30 mg via INTRAVENOUS
  Filled 2020-08-26: qty 1

## 2020-08-26 MED ORDER — CIPROFLOXACIN HCL 500 MG PO TABS: 500.0000 mg | ORAL_TABLET | Freq: Two times a day (BID) | ORAL | 0 refills | Status: DC

## 2020-08-26 MED ORDER — IOHEXOL 300 MG/ML  SOLN
100.0000 mL | Freq: Once | INTRAMUSCULAR | Status: AC | PRN
  Administered 2020-08-26: 100 mL via INTRAVENOUS

## 2020-08-26 MED ORDER — METRONIDAZOLE 500 MG PO TABS: 500.0000 mg | ORAL_TABLET | Freq: Three times a day (TID) | ORAL | 0 refills | Status: DC

## 2020-08-26 MED ORDER — KETOROLAC TROMETHAMINE 30 MG/ML IJ SOLN: 30.0000 mg | Freq: Once | INTRAMUSCULAR | Status: DC

## 2020-08-26 MED ORDER — ONDANSETRON 4 MG PO TBDP: 4.0000 mg | ORAL_TABLET | Freq: Three times a day (TID) | ORAL | 0 refills | Status: DC | PRN

## 2020-08-26 NOTE — ED Provider Notes (Signed)
[facility] Emergency Department Provider Note  ____________________________________________   Event Date/Time   First MD Initiated Contact with Patient 08/26/20 [phone]     (approximate)  I have reviewed the triage vital signs and the nursing notes.   HISTORY  Chief Complaint Abdominal Pain    HPI Margaret Gaines is a 50 y.o. female with history of hypertension, TIA who presents to the emergency department with pelvic pain for the past 2 days.  No associated fevers, nausea, vomiting, diarrhea, dysuria, hematuria, vaginal bleeding or discharge.  Last menstrual period was 2 months ago.  No previous abdominal surgeries.  States she did have a similar symptoms a few months ago and thought she was constipated but states now she is having normal bowel movements and passing gas regularly.  She is sexually active with one female partner, her husband.  She is not concerned that she could have an STD.  No aggravating or alleviating factors.        Past Medical History:  Diagnosis Date  . Hypertension   . TIA (transient ischemic attack)     Patient Active Problem List   Diagnosis Date Noted  . 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 Stroke (HCC) 12/01/2017  . Hypertension 12/01/2017    Past Surgical History:  Procedure Laterality Date  . CESAREAN SECTION     x2   . TEE WITHOUT CARDIOVERSION N/A 12/03/2017   Procedure: TRANSESOPHAGEAL ECHOCARDIOGRAM (TEE);  Surgeon: Iran OuchArida, Muhammad A, MD;  Location: [facility] ORS;  Service: Cardiovascular;  Laterality: N/A;    Prior to Admission medications   Medication Sig Start Date End Date Taking? Authorizing Provider  amLODipine (NORVASC) 10 MG tablet Take 1 tablet (10 mg total) by mouth daily. 12/04/17   Adrian SaranMody, Sital, MD  atorvastatin (LIPITOR) 20 MG tablet Take 20 mg by mouth at bedtime. 12/08/17   [provider]  Boric Acid CRYS Place 600 mg vaginally 2 (two) times a week. 07/11/19    Nadara MustardHarris, Robert P, MD  clopidogrel (PLAVIX) 75 MG tablet Take 1 tablet (75 mg total) by mouth daily. 12/04/17   Adrian SaranMody, Sital, MD  hydrochlorothiazide (HYDRODIURIL) 25 MG tablet Take 1 tablet (25 mg total) by mouth daily. 12/04/17   Adrian SaranMody, Sital, MD  ibuprofen (ADVIL,MOTRIN) 200 MG tablet Take 400 mg by mouth every 6 (six) hours as needed for headache.    [provider]  lisinopril (PRINIVIL,ZESTRIL) 40 MG tablet Take 40 mg by mouth daily. 12/07/17   [provider]  metroNIDAZOLE (FLAGYL) 500 MG tablet Take one pill po weekly for suppression 10/14/19   Nadara MustardHarris, Robert P, MD  pantoprazole (PROTONIX) 40 MG tablet Take 1 tablet (40 mg total) by mouth daily. 12/04/17   Adrian SaranMody, Sital, MD  rosuvastatin (CRESTOR) 10 MG tablet Take 1 tablet (10 mg total) by mouth daily. 12/04/17   Adrian SaranMody, Sital, MD    Allergies Silicone and Apricot flavor  History reviewed. No pertinent family history.  Social History Social History   Tobacco Use  . Smoking status: Never Smoker  . Smokeless tobacco: Never Used  Vaping Use  . Vaping Use: Unknown  Substance Use Topics  . Alcohol use: Yes    Comment: occ  . Drug use: Never    Review of Systems Constitutional: No fever. Eyes: No visual changes. ENT: No sore throat. Cardiovascular: Denies chest pain. Respiratory: Denies shortness of breath. Gastrointestinal: No nausea, vomiting, diarrhea. Genitourinary: Negative for dysuria. Musculoskeletal: Negative for back pain. Skin:  Negative for rash. Neurological: Negative for focal weakness or numbness.  ____________________________________________   PHYSICAL EXAM:  VITAL SIGNS: ED Triage Vitals [08/26/20 0515]  Enc Vitals Group     BP (!) 158/105     Pulse Rate 73     Resp 18     Temp 99.1 F (37.3 C)     Temp Source Oral     SpO2 98 %     Weight      Height      Head Circumference      Peak Flow      Pain Score      Pain Loc      Pain Edu?      Excl. in [place]?    CONSTITUTIONAL: Alert  and oriented and responds appropriately to questions. Well-appearing; well-nourished HEAD: Normocephalic EYES: Conjunctivae clear, pupils appear equal, EOM appear intact ENT: normal nose; moist mucous membranes NECK: Supple, normal ROM CARD: RRR; S1 and S2 appreciated; no murmurs, no clicks, no rubs, no gallops RESP: Normal chest excursion without splinting or tachypnea; breath sounds clear and equal bilaterally; no wheezes, no rhonchi, no rales, no hypoxia or respiratory distress, speaking full sentences ABD/GI: Normal bowel sounds; non-distended; soft, tender throughout the pelvic region as well as at McBurney's point with voluntary guarding, no rebound GU:  Normal external genitalia. No lesions, rashes noted. Patient has no vaginal bleeding on exam. No significant vaginal discharge.  Mild adnexal tenderness bilaterally but no fullness or mass appreciated.  Cervix is not appear friable.  Cervix is closed.  Chaperone present for exam. BACK: The back appears normal EXT: Normal ROM in all joints; no deformity noted, no edema; no cyanosis SKIN: Normal color for age and race; warm; no rash on exposed skin NEURO: Moves all extremities equally PSYCH: The patient's mood and manner are appropriate.  ____________________________________________   LABS (all labs ordered are listed, but only abnormal results are displayed)  Labs Reviewed  COMPREHENSIVE METABOLIC PANEL - Abnormal; Notable for the following components:      Result Value   Glucose, Bld 109 (*)    All other components within normal limits  URINALYSIS, COMPLETE (UACMP) WITH MICROSCOPIC - Abnormal; Notable for the following components:   Color, Urine YELLOW (*)    APPearance CLEAR (*)    All other components within normal limits  WET PREP, GENITAL  CHLAMYDIA/NGC RT PCR ([facility] ONLY)  LIPASE, BLOOD  CBC  POC URINE PREG, ED    ____________________________________________  EKG  None ____________________________________________  RADIOLOGY I, Lisia Westbay, personally viewed and evaluated these images (plain radiographs) as part of my medical decision making, as well as reviewing the written report by the radiologist.  ED MD interpretation:  [place] pending  Official radiology report(s): No results found.  ____________________________________________   PROCEDURES  Procedure(s) performed (including Critical Care):  None  ____________________________________________   INITIAL IMPRESSION / ASSESSMENT AND PLAN / ED COURSE  As part of my medical decision making, I reviewed the following data within the [facility] notes reviewed and incorporated, Labs reviewed, Patient signed out to Dr, Joni Fears, Notes from prior ED visits and [place] Controlled Substance Database         Patient here with pelvic pain.  Differential includes TOA, PID, ovarian torsion, ovarian cysts, appendicitis, constipation, UTI, kidney stone, diverticulitis, colitis.  Labs obtained in triage are unremarkable.  No leukocytosis.  Normal LFTs, lipase, creatinine.  Urine pregnancy test negative.  Urine does not appear infected.  Pelvic cultures  pending.  Will obtain transvaginal ultrasound with Doppler.  If this is unremarkable and does not reveal etiology for patient's pain, we will proceed with CT imaging.  Will give Toradol for discomfort here.     7:14 AM  Pt's wet prep unremarkable.  GC chlamydia pending.  Signed out to oncoming ED physician, Dr. Joni Fears to follow-up on patient's transvaginal ultrasound.  Plan is to obtain CT of abdomen pelvis if transvaginal ultrasound is unrevealing.  Patient comfortable with this plan.   I reviewed all nursing notes and pertinent previous records as available.  I have reviewed and interpreted any EKGs, lab and urine results, imaging (as  available).  ____________________________________________   FINAL CLINICAL IMPRESSION(S) / ED DIAGNOSES  Final diagnoses:  Lower abdominal pain     ED Discharge Orders    None      *Please note:  Margaret Gaines was evaluated in Emergency Department on 08/26/2020 for the symptoms described in the history of present illness. She was evaluated in the context of the global COVID-19 pandemic, which necessitated consideration that the patient might be at risk for infection with the SARS-CoV-2 virus that causes COVID-19. Institutional protocols and algorithms that pertain to the evaluation of patients at risk for COVID-19 are in a state of rapid change based on information released by regulatory bodies including the CDC and federal and state organizations. These policies and algorithms were followed during the patient's care in the ED.  Some ED evaluations and interventions may be delayed as a result of limited staffing during and the pandemic.*   Note:  This document was prepared using Dragon voice recognition software and may include unintentional dictation errors.   Lean Fayson, Delice Bison, DO 08/26/20 [phone]

## 2020-08-26 NOTE — ED Notes (Signed)
Pt transported for US

## 2020-08-26 NOTE — ED Provider Notes (Signed)
Procedures     ----------------------------------------- 11:50 AM on 08/26/2020 -----------------------------------------  Pelvic ultrasound shows uterine fibroids.  CT scan shows sigmoid diverticulitis, no appendicitis.  These findings were discussed with the patient.  I will prescribe Cipro and Flagyl, naproxen, Zofran as needed, recommend bland diet, stable for discharge home.  Final diagnoses:  Lower abdominal pain  Sigmoid diverticulitis       Carrie Mew, MD 08/26/20 1151

## 2020-08-26 NOTE — Discharge Instructions (Addendum)
Your CT scan shows diverticulitis in the lower large intestine.  Take antibiotics and anti-inflammatory medicine as prescribed and follow a bland diet.  Follow-up with your primary care doctor in a week

## 2020-08-26 NOTE — ED Triage Notes (Signed)
Pt c/o lower abdominal pain that radiates into lower back bilaterally, x 2 days. Pt denies N/V/D. Last BM x1 day. Urinary frequency reported without hematuria or dysuria.

## 2020-08-26 NOTE — ED Notes (Signed)
Pt taken for CT 

## 2020-09-07 ENCOUNTER — Other Ambulatory Visit: Payer: Self-pay

## 2020-09-07 ENCOUNTER — Emergency Department
Admission: EM | Admit: 2020-09-07 | Discharge: 2020-09-07 | Disposition: A | Discharge status: Home / Self Care | Payer: BC Managed Care – PPO | Attending: Emergency Medicine | Admitting: Emergency Medicine

## 2020-09-07 ENCOUNTER — Emergency Department: Payer: BC Managed Care – PPO

## 2020-09-07 DIAGNOSIS — Z7902 Long term (current) use of antithrombotics/antiplatelets: Secondary | ICD-10-CM | POA: Insufficient documentation

## 2020-09-07 DIAGNOSIS — U071 COVID-19: Secondary | ICD-10-CM | POA: Diagnosis not present

## 2020-09-07 DIAGNOSIS — Z79899 Other long term (current) drug therapy: Secondary | ICD-10-CM | POA: Diagnosis not present

## 2020-09-07 DIAGNOSIS — I10 Essential (primary) hypertension: Secondary | ICD-10-CM | POA: Diagnosis not present

## 2020-09-07 DIAGNOSIS — R059 Cough, unspecified: Secondary | ICD-10-CM | POA: Diagnosis present

## 2020-09-07 LAB — CBC
HCT: 38.9 % (ref 36.0–46.0)
Hemoglobin: 12.3 g/dL (ref 12.0–15.0)
MCH: 26.9 pg (ref 26.0–34.0)
MCHC: 31.6 g/dL (ref 30.0–36.0)
MCV: 85.1 fL (ref 80.0–100.0)
Platelets: 275 10*3/uL (ref 150–400)
RBC: 4.57 MIL/uL (ref 3.87–5.11)
RDW: 15.3 % (ref 11.5–15.5)
WBC: 3.6 10*3/uL — ABNORMAL LOW (ref 4.0–10.5)
nRBC: 0 % (ref 0.0–0.2)

## 2020-09-07 LAB — BASIC METABOLIC PANEL
Anion gap: 9 (ref 5–15)
BUN: 11 mg/dL (ref 6–20)
CO2: 23 mmol/L (ref 22–32)
Calcium: 8.8 mg/dL — ABNORMAL LOW (ref 8.9–10.3)
Chloride: 104 mmol/L (ref 98–111)
Creatinine, Ser: 0.81 mg/dL (ref 0.44–1.00)
GFR, Estimated: >60 mL/min (ref >60)
Glucose, Bld: 110 mg/dL — ABNORMAL HIGH (ref 70–99)
Potassium: 3.6 mmol/L (ref 3.5–5.1)
Sodium: 136 mmol/L (ref 135–145)

## 2020-09-07 LAB — HEPATIC FUNCTION PANEL
ALT: 18 U/L (ref 0–44)
AST: 27 U/L (ref 15–41)
Albumin: 3.6 g/dL (ref 3.5–5.0)
Alkaline Phosphatase: 53 U/L (ref 38–126)
Bilirubin, Direct: 0.1 mg/dL (ref 0.0–0.2)
Indirect Bilirubin: 0.5 mg/dL (ref 0.3–0.9)
Total Bilirubin: 0.6 mg/dL (ref 0.3–1.2)
Total Protein: 6.9 g/dL (ref 6.5–8.1)

## 2020-09-07 LAB — POC URINE PREG, ED: Preg Test, Ur: NEGATIVE

## 2020-09-07 LAB — RESP PANEL BY RT-PCR (FLU A&B, COVID) ARPGX2
Influenza A by PCR: NEGATIVE
Influenza B by PCR: NEGATIVE
SARS Coronavirus 2 by RT PCR: POSITIVE — AB

## 2020-09-07 LAB — TROPONIN I (HIGH SENSITIVITY)
Troponin I (High Sensitivity): 4 ng/L (ref <18)
Troponin I (High Sensitivity): 4 ng/L (ref <18)

## 2020-09-07 LAB — CK: Total CK: 104 U/L (ref 38–234)

## 2020-09-07 LAB — LIPASE, BLOOD: Lipase: 60 U/L — ABNORMAL HIGH (ref 11–51)

## 2020-09-07 MED ORDER — LIDOCAINE VISCOUS HCL 2 % MT SOLN
15.0000 mL | Freq: Once | OROMUCOSAL | Status: AC
  Administered 2020-09-07: 15 mL via ORAL
  Filled 2020-09-07: qty 15

## 2020-09-07 MED ORDER — ALUM & MAG HYDROXIDE-SIMETH 200-200-20 MG/5ML PO SUSP
30.0000 mL | Freq: Once | ORAL | Status: AC
  Administered 2020-09-07: 30 mL via ORAL
  Filled 2020-09-07: qty 30

## 2020-09-07 MED ORDER — FAMOTIDINE IN NACL 20-0.9 MG/50ML-% IV SOLN
20.0000 mg | Freq: Once | INTRAVENOUS | Status: AC
  Administered 2020-09-07: 20 mg via INTRAVENOUS
  Filled 2020-09-07: qty 50

## 2020-09-07 NOTE — Discharge Instructions (Addendum)
[facility]  [phone]  To help boost your immune system against COVID-19, please take:  - Vitamin D3 4,000 IU/day - Vitamin C 500-1,000?mg twice a day - Quercetin 250?mg twice a day - Zinc 100?mg/day - Melatonin 10?mg before bedtime (causes drowsiness) - Aspirin 325?mg/day (unless contraindicated) - Pulse Oximeter Monitoring of oxygen saturation is recommended - check your oxygen 3 times a day if less than 90% return to the ER  These medications are all over-the-counter and do not need a prescription.   QUARANTINE INSTRUCTION  Follow these instructions at home:  Protecting others To avoid spreading the illness to other people: Quarantine in your home until you have had no cough and fever for 7 days. Household members should also be quarantine for at least 14 days after being exposed to you. Wash your hands often with soap and water. If soap and water are not available, use an alcohol-based hand sanitizer. If you have not cleaned your hands, do not touch your face. Make sure that all people in your household wash their hands well and often. Cover your nose and mouth when you cough or sneeze. Throw away used tissues. Stay home if you have any cold-like or flu-like symptoms. General instructions Go to your local pharmacy and buy a pulse oximeter (this is a machine that measures your oxygen). Check your oxygen levels at least 3 times a day. If your oxygen level is 90% or less return to the emergency room immediately Take over-the-counter and prescription medicines only as told by your health care provider. If you need medication for fever take tylenol or ibuprofen Drink enough fluid to keep your urine pale yellow. Rest at home as directed by your health care provider. Do not give aspirin to a child with the flu, because of the association with Reye's syndrome. Do not use tobacco products, including cigarettes, chewing tobacco, and e-cigarettes. If you need help quitting,  ask your health care provider. Keep all follow-up visits as told by your health care provider. This is important. How is this prevented? Avoid areas where an outbreak has been reported. Avoid large groups of people. Keep a safe distance from people who are coughing and sneezing. Do not touch your face if you have not cleaned your hands. When you are around people who are sick or might be sick, wear a mask to protect yourself. Contact a health care provider if: You have symptoms of SARS (cough, fever, chest pain, shortness of breath) that are not getting better at home. You have a fever. If you have difficulty breathing go to your local ER or call 911

## 2020-09-07 NOTE — ED Provider Notes (Signed)
[facility] Emergency Department Provider Note  ____________________________________________  Time seen: Approximately 4:53 AM  I have reviewed the triage vital signs and the nursing notes.   HISTORY  Chief Complaint Chest Pain   HPI Margaret Gaines is a 50 y.o. female who presents for evaluation of several complaints.  Patient reports that she was seen in the emergency room 12 days ago and diagnosed with sigmoid diverticulitis.  She was discharged home  on Cipro and Flagyl.  She reports that over the last week since she started the antibiotics she has been feeling unwell.  She is complaining of diffuse muscle soreness of her entire back, intermittent burning sensation in her chest, throat soreness.  Has had a mild cough.  No fever or chills.  No shortness of breath, vomiting or diarrhea.  She is concerned that her symptoms are all side effects of the antibiotics.  She reports resolution of her abdominal pain.  She denies any known exposures to Covid.  She is not vaccinated.  Past Medical History:  Diagnosis Date  . Hypertension   . TIA (transient ischemic attack)     Patient Active Problem List   Diagnosis Date Noted  . 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 Stroke ([place]) 12/01/2017  . Hypertension 12/01/2017    Past Surgical History:  Procedure Laterality Date  . CESAREAN SECTION     x2   . TEE WITHOUT CARDIOVERSION N/A 12/03/2017   Procedure: TRANSESOPHAGEAL ECHOCARDIOGRAM (TEE);  Surgeon: Wellington Hampshire, MD;  Location: [facility] ORS;  Service: Cardiovascular;  Laterality: N/A;    Prior to Admission medications   Medication Sig Start Date End Date Taking? Authorizing Provider  amLODipine (NORVASC) 10 MG tablet Take 1 tablet (10 mg total) by mouth daily. 12/04/17   Bettey Costa, MD  atorvastatin (LIPITOR) 20 MG tablet Take 20 mg by mouth at bedtime. 12/08/17   [provider]  Boric Acid CRYS Place 600 mg  vaginally 2 (two) times a week. 07/11/19   Gae Dry, MD  ciprofloxacin (CIPRO) 500 MG tablet Take 1 tablet (500 mg total) by mouth 2 (two) times daily. 08/26/20   Carrie Mew, MD  clopidogrel (PLAVIX) 75 MG tablet Take 1 tablet (75 mg total) by mouth daily. 12/04/17   Bettey Costa, MD  hydrochlorothiazide (HYDRODIURIL) 25 MG tablet Take 1 tablet (25 mg total) by mouth daily. 12/04/17   Bettey Costa, MD  ibuprofen (ADVIL,MOTRIN) 200 MG tablet Take 400 mg by mouth every 6 (six) hours as needed for headache.    [provider]  lisinopril (PRINIVIL,ZESTRIL) 40 MG tablet Take 40 mg by mouth daily. 12/07/17   [provider]  metroNIDAZOLE (FLAGYL) 500 MG tablet Take 1 tablet (500 mg total) by mouth 3 (three) times daily. 08/26/20   Carrie Mew, MD  naproxen (NAPROSYN) 500 MG tablet Take 1 tablet (500 mg total) by mouth 2 (two) times daily with a meal. 08/26/20   Carrie Mew, MD  ondansetron (ZOFRAN ODT) 4 MG disintegrating tablet Take 1 tablet (4 mg total) by mouth every 8 (eight) hours as needed for nausea or vomiting. 08/26/20   Carrie Mew, MD  pantoprazole (PROTONIX) 40 MG tablet Take 1 tablet (40 mg total) by mouth daily. 12/04/17   Bettey Costa, MD  rosuvastatin (CRESTOR) 10 MG tablet Take 1 tablet (10 mg total) by mouth daily. 12/04/17   Bettey Costa, MD    Allergies Silicone and Apricot flavor  No family history  on file.  Social History Social History   Tobacco Use  . Smoking status: Never Smoker  . Smokeless tobacco: Never Used  Vaping Use  . Vaping Use: Unknown  Substance Use Topics  . Alcohol use: Yes    Comment: occ  . Drug use: Never    Review of Systems  Constitutional: Negative for fever. Eyes: Negative for visual changes. ENT:+ sore throat. Neck: No neck pain  Cardiovascular: + chest pain. Respiratory: Negative for shortness of breath. Gastrointestinal: Negative for abdominal pain, vomiting or diarrhea. Genitourinary: Negative for  dysuria. Musculoskeletal: + sore muscle pain of her back Skin: Negative for rash. Neurological: Negative for headaches, weakness or numbness. Psych: No SI or HI  ____________________________________________   PHYSICAL EXAM:  VITAL SIGNS: ED Triage Vitals  Enc Vitals Group     BP 09/07/20 0121 (!) 169/107     Pulse Rate 09/07/20 0121 (!) 56     Resp 09/07/20 0121 18     Temp 09/07/20 0121 98.3 F (36.8 C)     Temp src --      SpO2 09/07/20 0121 99 %     Weight 09/07/20 0120 202 lb (91.6 kg)     Height 09/07/20 0120 5\' 6"  (1.676 m)     Head Circumference --      Peak Flow --      Pain Score 09/07/20 0119 6     Pain Loc --      Pain Edu? --      Excl. in [place]? --     Constitutional: Alert and oriented. Well appearing and in no apparent distress. HEENT:      Head: Normocephalic and atraumatic.         Eyes: Conjunctivae are normal. Sclera is non-icteric.       Mouth/Throat: Mucous membranes are moist.  Oropharynx is clear with no swelling, no angioedema, uvula is midline, no erythema.  No stridor.      Neck: Supple with no signs of meningismus. Cardiovascular: Regular rate and rhythm. No murmurs, gallops, or rubs. 2+ symmetrical distal pulses are present in all extremities. No JVD. Respiratory: Normal respiratory effort. Lungs are clear to auscultation bilaterally. No wheezes, crackles, or rhonchi.  Gastrointestinal: Soft, non tender, and non distended with positive bowel sounds. No rebound or guarding. Musculoskeletal: Patient reports diffuse soreness with palpation of the muscles of her back.  Nontender with normal range of motion in all extremities. No edema, cyanosis, or erythema of extremities. Neurologic: Normal speech and language. Face is symmetric. Moving all extremities. No gross focal neurologic deficits are appreciated. Skin: Skin is warm, dry and intact. No rash noted. Psychiatric: Mood and affect are normal. Speech and behavior are  normal.  ____________________________________________   LABS (all labs ordered are listed, but only abnormal results are displayed)  Labs Reviewed  RESP PANEL BY RT-PCR (FLU A&B, COVID) ARPGX2 - Abnormal; Notable for the following components:      Result Value   SARS Coronavirus 2 by RT PCR POSITIVE (*)    All other components within normal limits  BASIC METABOLIC PANEL - Abnormal; Notable for the following components:   Glucose, Bld 110 (*)    Calcium 8.8 (*)    All other components within normal limits  CBC - Abnormal; Notable for the following components:   WBC 3.6 (*)    All other components within normal limits  LIPASE, BLOOD - Abnormal; Notable for the following components:   Lipase 60 (*)    All  other components within normal limits  CK  HEPATIC FUNCTION PANEL  POC URINE PREG, ED  TROPONIN I (HIGH SENSITIVITY)  TROPONIN I (HIGH SENSITIVITY)   ____________________________________________  EKG  ED ECG REPORT I, Rudene Re, the attending physician, personally viewed and interpreted this ECG.  Sinus bradycardia rate of 56, normal intervals, normal axis, normal EKG ____________________________________________  RADIOLOGY  I have personally reviewed the images performed during this visit and I agree with the Radiologist's read.   Interpretation by Radiologist:  DG Chest 2 View  Result Date: 09/07/2020 CLINICAL DATA:  Chest pain. EXAM: CHEST - 2 VIEW COMPARISON:  None. FINDINGS: The cardiomediastinal contours are normal. The lungs are clear. Pulmonary vasculature is normal. No consolidation, pleural effusion, or pneumothorax. No acute osseous abnormalities are seen. IMPRESSION: No acute chest findings. Electronically Signed   By: Keith Rake M.D.   On: 09/07/2020 02:09     ____________________________________________   PROCEDURES  Procedure(s) performed: None Procedures Critical Care performed:   None ____________________________________________   INITIAL IMPRESSION / ASSESSMENT AND PLAN / ED COURSE   50 y.o. female who presents for evaluation of several complaints x 1 week which she is concerned could be adverse reactions to abx given for diverticulitis.  Patient is well-appearing in no distress, slightly hypertensive but remainder of her exam is unremarkable.  No signs of an anaphylaxis, no angioedema, no throat swelling or erythema, no stridor, no difficulty breathing.  Exam is otherwise unremarkable.  EKG with no signs of acute ischemia.  Chest x-ray visualized by me with no acute findings, confirmed by radiology.  Ddx GERD/ indigestion vs covid vs side effect of meds vs pancreatitis, vs gastritis, vs gallbladder pathology, vs rhabdo.  Labs showing mild leukopenia which patient has had in the past, normal metabolic panel, first troponin is negative.  Second troponin is pending.  Will check LFTs and lipase, which do a Covid swab.  Will check total CK.  We will treat with IV Pepcid and a GI cocktail for now.  Patient placed on telemetry for monitoring of cardiorespiratory status.  Old medical records reviewed including most recent visit for her diverticulitis _________________________ 6:30 AM on 09/07/2020 -----------------------------------------  Patient symptoms resolved after GI cocktail.  Her Covid test is positive which is most likely the cause of her symptoms she had recommended continuing the antibiotics at home.  At this time she is stable for discharge home with no respiratory symptoms, no hypoxia both at rest and with ambulation.  Discussed quarantine, treatment of symptoms at home, immune system boosting, and oxygen monitoring.  [place] clinic information given to patient.  Discussed my standard return precautions.       _____________________________________________ Please note:  Patient was evaluated in Emergency Department today for the symptoms described in the  history of present illness. Patient was evaluated in the context of the global COVID-19 pandemic, which necessitated consideration that the patient might be at risk for infection with the SARS-CoV-2 virus that causes COVID-19. Institutional protocols and algorithms that pertain to the evaluation of patients at risk for COVID-19 are in a state of rapid change based on information released by regulatory bodies including the CDC and federal and state organizations. These policies and algorithms were followed during the patient's care in the ED.  Some ED evaluations and interventions may be delayed as a result of limited staffing during the pandemic.   [place] Controlled Substance Database was reviewed by me. ____________________________________________   FINAL CLINICAL IMPRESSION(S) / ED DIAGNOSES   Final  diagnoses:  COVID-19      NEW MEDICATIONS STARTED DURING THIS VISIT:  ED Discharge Orders    None       Note:  This document was prepared using Dragon voice recognition software and may include unintentional dictation errors.    Alfred Levins, [place], MD 09/07/20 [phone]

## 2020-09-07 NOTE — ED Notes (Signed)
Lab reports pt covid positive, Dr. Alfred Levins notified

## 2020-09-07 NOTE — ED Notes (Signed)
Pt reports centralized chest pressure radiating into throat. Pt reports symptoms improve when sitting up. States symptoms started 1 week ago, states this is also when she started taking cipro and flagyl. Reports she saw her PCP yesterday who preformed an EKG and had her scheduled for an xray today, but states the pain was "too bad to wait through the night."

## 2020-09-07 NOTE — ED Triage Notes (Signed)
Pt states every since she started taking cipro and flagyl shes had back chest and throat pain, none of which was present prior to taking medications. Pt states this has been going on for about a week.

## 2020-09-08 ENCOUNTER — Telehealth: Payer: Self-pay | Admitting: Adult Health

## 2020-09-08 NOTE — Telephone Encounter (Signed)
Called to discuss with patient about COVID-19 symptoms and the use of one of the available treatments for those with mild to moderate Covid symptoms and at a high risk of hospitalization.  Pt appears to qualify for outpatient treatment due to co-morbid conditions and/or a member of an at-risk group in accordance with the FDA Emergency Use Authorization.    Symptom onset: 2/18  Vaccinated: Yes  Booster? Yes  Immunocompromised? No  Qualifiers: Yes   Unable to reach pt - Called patient , discussed treatment options , she declines .  Please contact PCP  for sooner follow up if symptoms do not improve or worsen or seek emergency care    Rexene Edison NP

## 2021-07-03 ENCOUNTER — Other Ambulatory Visit: Payer: Self-pay

## 2021-07-03 ENCOUNTER — Emergency Department: Payer: BC Managed Care – PPO

## 2021-07-03 ENCOUNTER — Emergency Department
Admission: EM | Admit: 2021-07-03 | Discharge: 2021-07-03 | Disposition: A | Discharge status: Home / Self Care | Payer: BC Managed Care – PPO | Attending: Emergency Medicine | Admitting: Emergency Medicine

## 2021-07-03 DIAGNOSIS — R202 Paresthesia of skin: Secondary | ICD-10-CM | POA: Diagnosis not present

## 2021-07-03 DIAGNOSIS — Z79899 Other long term (current) drug therapy: Secondary | ICD-10-CM | POA: Diagnosis not present

## 2021-07-03 DIAGNOSIS — I10 Essential (primary) hypertension: Secondary | ICD-10-CM | POA: Diagnosis not present

## 2021-07-03 LAB — CBC
HCT: 42.3 % (ref 36.0–46.0)
Hemoglobin: 13.6 g/dL (ref 12.0–15.0)
MCH: 26.6 pg (ref 26.0–34.0)
MCHC: 32.2 g/dL (ref 30.0–36.0)
MCV: 82.6 fL (ref 80.0–100.0)
Platelets: 258 10*3/uL (ref 150–400)
RBC: 5.12 MIL/uL — ABNORMAL HIGH (ref 3.87–5.11)
RDW: 14.9 % (ref 11.5–15.5)
WBC: 6.2 10*3/uL (ref 4.0–10.5)
nRBC: 0 % (ref 0.0–0.2)

## 2021-07-03 LAB — COMPREHENSIVE METABOLIC PANEL
ALT: 19 U/L (ref 0–44)
AST: 21 U/L (ref 15–41)
Albumin: 4.4 g/dL (ref 3.5–5.0)
Alkaline Phosphatase: 78 U/L (ref 38–126)
Anion gap: 8 (ref 5–15)
BUN: 16 mg/dL (ref 6–20)
CO2: 29 mmol/L (ref 22–32)
Calcium: 9.5 mg/dL (ref 8.9–10.3)
Chloride: 101 mmol/L (ref 98–111)
Creatinine, Ser: 0.94 mg/dL (ref 0.44–1.00)
GFR, Estimated: >60 mL/min (ref >60)
Glucose, Bld: 96 mg/dL (ref 70–99)
Potassium: 3.7 mmol/L (ref 3.5–5.1)
Sodium: 138 mmol/L (ref 135–145)
Total Bilirubin: 0.4 mg/dL (ref 0.3–1.2)
Total Protein: 7.3 g/dL (ref 6.5–8.1)

## 2021-07-03 LAB — TROPONIN I (HIGH SENSITIVITY): Troponin I (High Sensitivity): 8 ng/L (ref <18)

## 2021-07-03 NOTE — ED Provider Notes (Signed)
[facility] Emergency Department Provider Note ____________________________________________   Event Date/Time   First MD Initiated Contact with Patient 07/03/21 2129     (approximate)  I have reviewed the triage vital signs and the nursing notes.  HISTORY  Chief Complaint Hypertension   HPI Margaret Gaines is a 50 y.o. femalewho presents to the ED for evaluation of hypertension.   Chart review indicates hx HTN on amlodipine, HCTZ and lisinopril. Hx stroke on plavix.   Patient presents to the ED for evaluation of asymptomatic hypertension.  She reports that she was on lisinopril for a long time, but just started checking her blood pressure more regularly last week and noting quite hypertensive pressures.  Thursday, Friday and Saturday she tells me she had BP readings frequently with systolics greater than 938.    She saw her PCP on Monday, got started on amlodipine, and then saw him again yesterday in the clinic for follow-up and had HCTZ started.  She presents to the ED today for evaluation of intermittent tingling and paresthesias to her face and bilateral fingers.  She reports her feet and hands feel full bilaterally.  She denies any focal weakness, gait change, dropping objects, syncope or falls, difficulty speaking.  Denies chest pain or shortness of breath.  She reports no paresthesias right now that she feels fine.  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 Authorizing Provider  amLODipine (NORVASC) 10 MG tablet Take 1 tablet (10 mg total) by mouth daily. 12/04/17   Bettey Costa, MD  atorvastatin (LIPITOR) 20 MG tablet Take 20 mg by mouth at bedtime. 12/08/17   [provider]  Boric Acid CRYS Place 600 mg vaginally 2 (two) times a week. 07/11/19   Gae Dry, MD  ciprofloxacin (CIPRO) 500 MG tablet Take 1 tablet (500 mg total) by mouth 2 (two) times daily. 08/26/20   Carrie Mew, MD  clopidogrel (PLAVIX) 75 MG tablet Take 1 tablet (75 mg total) by mouth daily. 12/04/17   Bettey Costa, MD  hydrochlorothiazide (HYDRODIURIL) 25 MG tablet Take 1 tablet (25 mg total) by mouth daily. 12/04/17   Bettey Costa, MD  ibuprofen (ADVIL,MOTRIN) 200 MG tablet Take 400 mg by mouth every 6 (six) hours as needed for headache.    [provider]  lisinopril (PRINIVIL,ZESTRIL) 40 MG tablet Take 40 mg by mouth daily. 12/07/17   [provider]  metroNIDAZOLE (FLAGYL) 500 MG tablet Take 1 tablet (500 mg total) by mouth 3 (three) times daily. 08/26/20   Carrie Mew, MD  naproxen (NAPROSYN) 500 MG tablet Take 1 tablet (500 mg total) by mouth 2 (two) times daily with a meal. 08/26/20   Carrie Mew, MD  ondansetron (ZOFRAN ODT) 4 MG disintegrating tablet Take 1 tablet (4 mg total) by mouth every 8 (eight) hours as needed for nausea or vomiting. 08/26/20   Joni Fears,  Doren Custard, MD  pantoprazole (PROTONIX) 40 MG tablet Take 1 tablet (40 mg total) by mouth daily. 12/04/17   Bettey Costa, MD  rosuvastatin (CRESTOR) 10 MG tablet Take 1 tablet (10 mg total) by mouth daily. 12/04/17   Bettey Costa, MD    Allergies Silicone and Apricot flavor  No family history on file.  Social History Social History   Tobacco Use   Smoking status: Never   Smokeless tobacco: Never  Vaping Use   Vaping Use: Unknown  Substance Use Topics   Alcohol use: Yes    Comment: occ    Drug use: Never    Review of Systems  Constitutional: No fever/chills Eyes: No visual changes. ENT: No sore throat. Cardiovascular: Denies chest pain. Respiratory: Denies shortness of breath. Gastrointestinal: No abdominal pain.  No nausea, no vomiting.  No diarrhea.  No constipation. Genitourinary: Negative for dysuria. Musculoskeletal: Negative for back pain. Skin: Negative for rash. Neurological: Negative for headaches, focal weakness or numbness. Positive for paresthesias  ____________________________________________   PHYSICAL EXAM:  VITAL SIGNS: Vitals:   07/03/21 2200 07/03/21 2300  BP: (!) 191/99 (!) 184/105  Pulse:    Resp: 17 17  Temp:    SpO2:  98%    Constitutional: Alert and oriented. Well appearing and in no acute distress. Ambulatory independently with normal gait. Eyes: Conjunctivae are normal. PERRL. EOMI. Head: Atraumatic. Nose: No congestion/rhinnorhea. Mouth/Throat: Mucous membranes are moist.  Oropharynx non-erythematous. Neck: No stridor. No cervical spine tenderness to palpation. Cardiovascular: Normal rate, regular rhythm. Grossly normal heart sounds.  Good peripheral circulation. Respiratory: Normal respiratory effort.  No retractions. Lungs CTAB. Gastrointestinal: Soft , nondistended, nontender to palpation. No CVA tenderness. Musculoskeletal: No lower extremity tenderness nor edema.  No joint effusions. No signs of acute trauma. Neurologic:  Normal speech and language. No gross focal neurologic deficits are appreciated. No gait instability noted. Cranial nerves II through XII intact 5/5 strength and sensation in all 4 extremities Skin:  Skin is warm, dry and intact. No rash noted. Psychiatric: Mood and affect are normal. Speech and behavior are normal.  ____________________________________________   LABS (all labs ordered are listed, but only abnormal results are displayed)  Labs Reviewed  CBC - Abnormal; Notable for the following  components:      Result Value   RBC 5.12 (*)    All other components within normal limits  COMPREHENSIVE METABOLIC PANEL  TROPONIN I (HIGH SENSITIVITY)  TROPONIN I (HIGH SENSITIVITY)   ____________________________________________  12 Lead EKG  Sinus rhythm, rate of 62 bpm.  Normal axis and intervals.  No evidence of acute ischemia. ____________________________________________  RADIOLOGY  ED MD interpretation: CT head reviewed by me without evidence of acute intracranial pathology.  Official radiology report(s): CT HEAD WO CONTRAST (5MM)  Result Date: 07/03/2021 CLINICAL DATA:  Hypertension, headache EXAM: CT HEAD WITHOUT CONTRAST TECHNIQUE: Contiguous axial images were obtained from the base of the skull through the vertex without intravenous contrast. COMPARISON:  12/01/2017 FINDINGS: Brain: No acute intracranial abnormality. Specifically, no hemorrhage, hydrocephalus, mass lesion, acute infarction, or significant intracranial injury. Vascular: No hyperdense vessel or unexpected calcification. Skull: No acute calvarial abnormality. Sinuses/Orbits: No acute findings Other: None IMPRESSION: Normal study. Electronically Signed   By: Rolm Baptise M.D.   On: 07/03/2021 22:57    ____________________________________________   PROCEDURES and INTERVENTIONS  Procedure(s) performed (including Critical Care):  Procedures  Medications - No data to display  ____________________________________________   MDM / ED COURSE   50 year old female presents to the  ED with essentially asymptomatic hypertension amenable to outpatient management with continued PCP follow-up.  She looks clinically well without evidence of systemic illness.  No neurologic or vascular deficits.  No signs of trauma.  No signs of endorgan damage, ACS, ICH or CVA.  Blood work is benign.  I see no barriers to outpatient management.  We discussed adherence to her antihypertensive regimen, following up with her PCP and return  precautions for the ED.  Clinical Course as of 07/03/21 2329  Wed Jul 03, 2021  2323 Reassessed.  Patient reports feeling well and is asymptomatic.  We discussed management at home and return precautions for the ED. [DS]    Clinical Course User Index [DS] Vladimir Crofts, MD    ____________________________________________   FINAL CLINICAL IMPRESSION(S) / ED DIAGNOSES  Final diagnoses:  Primary hypertension     ED Discharge Orders     None        Larhonda Dettloff Tamala Julian   Note:  This document was prepared using Dragon voice recognition software and may include unintentional dictation errors.    Vladimir Crofts, MD 07/03/21 2330

## 2021-07-03 NOTE — ED Notes (Signed)
ED Provider at bedside. 

## 2021-07-03 NOTE — ED Triage Notes (Addendum)
Pt in with co htn states was 170/105 pmd has been adjusting meds at home. Pt co tingling to fingers and face since this am. Pt has no other complaints, denies any pain other discomfort.

## 2021-07-03 NOTE — Discharge Instructions (Signed)
Keep taking all 3 of your blood pressure medications.  And keep track of your blood pressures at home.  Try to check it when you feel normal and your normal environment at home to get a better idea of what your blood pressure is day to day.  If you develop any strokelike symptoms, passing out, severe chest pain or difficulty breathing, please return to the ED.

## 2021-09-30 ENCOUNTER — Other Ambulatory Visit: Payer: Self-pay

## 2021-09-30 DIAGNOSIS — Z1211 Encounter for screening for malignant neoplasm of colon: Secondary | ICD-10-CM

## 2021-09-30 MED ORDER — PEG 3350-KCL-NA BICARB-NACL 420 G PO SOLR: 4000.0000 mL | Freq: Once | ORAL | 0 refills | Status: AC

## 2021-09-30 NOTE — Progress Notes (Signed)
Gastroenterology Pre-Procedure Review ? ?Request Date: 10/28/2021 ?Requesting Physician: Dr. Vicente Males ? ?PATIENT REVIEW QUESTIONS: The patient responded to the following health history questions as indicated:   ? ?1. Are you having any GI issues? no ?2. Do you have a personal history of Polyps? no ?3. Do you have a family history of Colon Cancer or Polyps? no ?4. Diabetes Mellitus? no ?5. Joint replacements in the past 12 months?no ?6. Major health problems in the past 3 months?no ?7. Any artificial heart valves, MVP, or defibrillator?no ?   ?MEDICATIONS & ALLERGIES:    ?Patient reports the following regarding taking any anticoagulation/antiplatelet therapy:   ?Plavix, Coumadin, Eliquis, Xarelto, Lovenox, Pradaxa, Brilinta, or Effient? no ?Aspirin? yes (81 mg) ? ?Patient confirms/reports the following medications:  ?Current Outpatient Medications  ?Medication Sig Dispense Refill  ? amLODipine (NORVASC) 10 MG tablet Take 1 tablet (10 mg total) by mouth daily. 30 tablet 0  ? atorvastatin (LIPITOR) 20 MG tablet Take 20 mg by mouth at bedtime.  5  ? Boric Acid CRYS Place 600 mg vaginally 2 (two) times a week. 500 g 5  ? ciprofloxacin (CIPRO) 500 MG tablet Take 1 tablet (500 mg total) by mouth 2 (two) times daily. 14 tablet 0  ? clopidogrel (PLAVIX) 75 MG tablet Take 1 tablet (75 mg total) by mouth daily. 30 tablet 0  ? hydrochlorothiazide (HYDRODIURIL) 25 MG tablet Take 1 tablet (25 mg total) by mouth daily. 30 tablet 0  ? ibuprofen (ADVIL,MOTRIN) 200 MG tablet Take 400 mg by mouth every 6 (six) hours as needed for headache.    ? lisinopril (PRINIVIL,ZESTRIL) 40 MG tablet Take 40 mg by mouth daily.  2  ? metroNIDAZOLE (FLAGYL) 500 MG tablet Take 1 tablet (500 mg total) by mouth 3 (three) times daily. 30 tablet 0  ? naproxen (NAPROSYN) 500 MG tablet Take 1 tablet (500 mg total) by mouth 2 (two) times daily with a meal. 20 tablet 0  ? ondansetron (ZOFRAN ODT) 4 MG disintegrating tablet Take 1 tablet (4 mg total) by mouth  every 8 (eight) hours as needed for nausea or vomiting. 20 tablet 0  ? pantoprazole (PROTONIX) 40 MG tablet Take 1 tablet (40 mg total) by mouth daily. 30 tablet 0  ? rosuvastatin (CRESTOR) 10 MG tablet Take 1 tablet (10 mg total) by mouth daily. 30 tablet 0  ? ?No current facility-administered medications for this visit.  ? ? ?Patient confirms/reports the following allergies:  ?Allergies  ?Allergen Reactions  ? Silicone   ? Apricot Flavor Other (See Comments)  ?  Tingling to lips  ? ? ?No orders of the defined types were placed in this encounter. ? ? ?AUTHORIZATION INFORMATION ?Primary Insurance: ?1D#: ?Group #: ? ?Secondary Insurance: ?1D#: ?Group #: ? ?SCHEDULE INFORMATION: ?Date:10/28/2021  ?Time: ?Location:[facility] ? ?

## 2021-10-01 ENCOUNTER — Other Ambulatory Visit: Payer: Self-pay

## 2021-10-25 ENCOUNTER — Encounter: Payer: Self-pay | Admitting: Gastroenterology

## 2021-10-28 ENCOUNTER — Ambulatory Visit: Payer: BC Managed Care – PPO | Admitting: Registered Nurse

## 2021-10-28 ENCOUNTER — Encounter: Payer: Self-pay | Admitting: Gastroenterology

## 2021-10-28 ENCOUNTER — Ambulatory Visit
Admission: RE | Admit: 2021-10-28 | Discharge: 2021-10-28 | Disposition: A | Discharge status: Home / Self Care | Payer: BC Managed Care – PPO | Attending: Gastroenterology | Admitting: Gastroenterology

## 2021-10-28 ENCOUNTER — Encounter
Admission: RE | Disposition: A | Discharge status: Home / Self Care | Payer: Self-pay | Source: Home / Self Care | Attending: Gastroenterology

## 2021-10-28 DIAGNOSIS — Z1211 Encounter for screening for malignant neoplasm of colon: Secondary | ICD-10-CM | POA: Diagnosis present

## 2021-10-28 DIAGNOSIS — I69398 Other sequelae of cerebral infarction: Secondary | ICD-10-CM | POA: Insufficient documentation

## 2021-10-28 DIAGNOSIS — D120 Benign neoplasm of cecum: Secondary | ICD-10-CM | POA: Diagnosis not present

## 2021-10-28 DIAGNOSIS — I10 Essential (primary) hypertension: Secondary | ICD-10-CM | POA: Diagnosis not present

## 2021-10-28 DIAGNOSIS — Z79899 Other long term (current) drug therapy: Secondary | ICD-10-CM | POA: Insufficient documentation

## 2021-10-28 DIAGNOSIS — D122 Benign neoplasm of ascending colon: Secondary | ICD-10-CM | POA: Insufficient documentation

## 2021-10-28 DIAGNOSIS — K635 Polyp of colon: Secondary | ICD-10-CM | POA: Diagnosis not present

## 2021-10-28 DIAGNOSIS — K5730 Diverticulosis of large intestine without perforation or abscess without bleeding: Secondary | ICD-10-CM | POA: Insufficient documentation

## 2021-10-28 HISTORY — PX: COLONOSCOPY WITH PROPOFOL: SHX5780

## 2021-10-28 LAB — POCT PREGNANCY, URINE: Preg Test, Ur: NEGATIVE

## 2021-10-28 SURGERY — COLONOSCOPY WITH PROPOFOL
Anesthesia: General

## 2021-10-28 MED ORDER — HYDRALAZINE HCL 20 MG/ML IJ SOLN
INTRAMUSCULAR | Status: AC
  Filled 2021-10-28: qty 1

## 2021-10-28 MED ORDER — ONDANSETRON HCL 4 MG/2ML IJ SOLN
INTRAMUSCULAR | Status: DC | PRN
  Administered 2021-10-28: 4 mg via INTRAVENOUS

## 2021-10-28 MED ORDER — PROPOFOL 10 MG/ML IV BOLUS
INTRAVENOUS | Status: DC | PRN
  Administered 2021-10-28: 90 mg via INTRAVENOUS
  Administered 2021-10-28: 10 mg via INTRAVENOUS

## 2021-10-28 MED ORDER — HYDRALAZINE HCL 20 MG/ML IJ SOLN
INTRAMUSCULAR | Status: DC | PRN
Start: 2021-10-28 — End: 2021-10-28
  Administered 2021-10-28: 5 mg via INTRAVENOUS

## 2021-10-28 MED ORDER — SODIUM CHLORIDE 0.9 % IV SOLN: INTRAVENOUS | Status: DC

## 2021-10-28 MED ORDER — LIDOCAINE HCL (CARDIAC) PF 100 MG/5ML IV SOSY
PREFILLED_SYRINGE | INTRAVENOUS | Status: DC | PRN
  Administered 2021-10-28: 100 mg via INTRAVENOUS

## 2021-10-28 MED ORDER — PROPOFOL 500 MG/50ML IV EMUL
INTRAVENOUS | Status: DC | PRN
  Administered 2021-10-28: 150 ug/kg/min via INTRAVENOUS

## 2021-10-28 NOTE — Transfer of Care (Signed)
Immediate Anesthesia Transfer of Care Note ? ?Patient: Margaret Gaines ? ?Procedure(s) Performed: COLONOSCOPY WITH PROPOFOL ? ?Patient Location: Endoscopy Unit ? ?Anesthesia Type:General ? ?Level of Consciousness: drowsy ? ?Airway & Oxygen Therapy: Patient Spontanous Breathing ? ?Post-op Assessment: Report given to RN and Post -op Vital signs reviewed and stable ? ?Post vital signs: Reviewed and stable ? ?Last Vitals:  ?Vitals Value Taken Time  ?BP    ?Temp    ?Pulse 73 10/28/21 1012  ?Resp 17 10/28/21 1012  ?SpO2 100 % 10/28/21 1012  ?Vitals shown include unvalidated device data. ? ?Last Pain:  ?Vitals:  ? 10/28/21 0917  ?TempSrc: Temporal  ?   ? ?  ? ?Complications: No notable events documented. ?

## 2021-10-28 NOTE — Anesthesia Postprocedure Evaluation (Signed)
Anesthesia Post Note ? ?Patient: Margaret Gaines ? ?Procedure(s) Performed: COLONOSCOPY WITH PROPOFOL ? ?Patient location during evaluation: PACU ?Anesthesia Type: General ?Level of consciousness: awake and alert, oriented and patient cooperative ?Pain management: pain level controlled ?Vital Signs Assessment: post-procedure vital signs reviewed and stable ?Respiratory status: spontaneous breathing, nonlabored ventilation and respiratory function stable ?Cardiovascular status: blood pressure returned to baseline and stable ?Postop Assessment: adequate PO intake ?Anesthetic complications: no ? ? ?No notable events documented. ? ? ?Last Vitals:  ?Vitals:  ? 10/28/21 1022 10/28/21 1032  ?BP: 125/73 131/78  ?Pulse: 73 69  ?Resp: (!) 23 12  ?Temp:    ?SpO2:    ?  ?Last Pain:  ?Vitals:  ? 10/28/21 1032  ?TempSrc:   ?PainSc: 0-No pain  ? ? ?  ?  ?  ?  ?  ?  ? ?Darrin Nipper ? ? ? ? ?

## 2021-10-28 NOTE — H&P (Signed)
? ? ? ?Jonathon Bellows, MD ?[address] [place], [place], [place], [address] ?[address] [place], [place], [place], [address] ?Phone: [phone]  ?Fax: [phone] ? ?Primary Care Physician:  Lorelee Market, MD ? ? ?Pre-Procedure History & Physical: ?HPI:  Margaret Gaines is a 51 y.o. female is here for an colonoscopy. ?  ?Past Medical History:  ?Diagnosis Date  ? Hypertension   ? TIA (transient ischemic attack)   ? ? ?Past Surgical History:  ?Procedure Laterality Date  ? CESAREAN SECTION    ? x2   ? TEE WITHOUT CARDIOVERSION N/A 12/03/2017  ? Procedure: TRANSESOPHAGEAL ECHOCARDIOGRAM (TEE);  Surgeon: Wellington Hampshire, MD;  Location: [facility] ORS;  Service: Cardiovascular;  Laterality: N/A;  ? ? ?Prior to Admission medications   ?Medication Sig Start Date End Date Taking? Authorizing Provider  ?amLODipine (NORVASC) 10 MG tablet Take 1 tablet (10 mg total) by mouth daily. 12/04/17  Yes Bettey Costa, MD  ?aspirin EC 81 MG tablet Take 81 mg by mouth daily. Swallow whole.   Yes [provider]  ?hydrochlorothiazide (HYDRODIURIL) 25 MG tablet Take 1 tablet (25 mg total) by mouth daily. 12/04/17  Yes Mody, Ulice Bold, MD  ?lisinopril (PRINIVIL,ZESTRIL) 40 MG tablet Take 40 mg by mouth daily. 12/07/17  Yes [provider]  ?ondansetron (ZOFRAN ODT) 4 MG disintegrating tablet Take 1 tablet (4 mg total) by mouth every 8 (eight) hours as needed for nausea or vomiting. 08/26/20  Yes Carrie Mew, MD  ?rosuvastatin (CRESTOR) 10 MG tablet Take 1 tablet (10 mg total) by mouth daily. 12/04/17  Yes Bettey Costa, MD  ?ibuprofen (ADVIL,MOTRIN) 200 MG tablet Take 400 mg by mouth every 6 (six) hours as needed for headache.    [provider]  ? ? ?Allergies as of 09/30/2021 - Review Complete 07/03/2021  ?Allergen Reaction Noted  ? Silicone  77/41/2878  ? Apricot flavor Other (See Comments) 12/01/2017  ? ? ?History reviewed. No pertinent family history. ? ?Social History  ? ?Socioeconomic History  ? Marital  status: Married  ?  Spouse name: Not on file  ? Number of children: Not on file  ? Years of education: Not on file  ? Highest education level: Not on file  ?Occupational History  ? Not on file  ?Tobacco Use  ? Smoking status: Never  ? Smokeless tobacco: Never  ?Vaping Use  ? Vaping Use: Unknown  ?Substance and Sexual Activity  ? Alcohol use: Yes  ?  Comment: occ  ? Drug use: Never  ? Sexual activity: Yes  ?Other Topics Concern  ? Not on file  ?Social History Narrative  ? Not on file  ? ?Social Determinants of Health  ? ?Financial Resource Strain: Not on file  ?Food Insecurity: Not on file  ?Transportation Needs: Not on file  ?Physical Activity: Not on file  ?Stress: Not on file  ?Social Connections: Not on file  ?Intimate Partner Violence: Not on file  ? ? ?Review of Systems: ?See HPI, otherwise negative ROS ? ?Physical Exam: ?BP (!) 137/96   Pulse (!) 52   Temp (!) 97.2 ?F (36.2 ?C) (Temporal)   Resp 20   Ht '5\' 6"'$  (1.676 m)   Wt 92.5 kg   SpO2 100%   BMI 32.93 kg/m?  ?General:   Alert,  pleasant and cooperative in NAD ?Head:  Normocephalic and atraumatic. ?Neck:  Supple; no masses or thyromegaly. ?Lungs:  Clear throughout to auscultation, normal respiratory effort.    ?Heart:  +S1, +S2,  Regular rate and rhythm, No edema. ?Abdomen:  Soft, nontender and nondistended. Normal bowel sounds, without guarding, and without rebound.   ?Neurologic:  Alert and  oriented x4;  grossly normal neurologically. ? ?Impression/Plan: ?Margaret Gaines is here for an colonoscopy to be performed for Screening colonoscopy average risk   ?Risks, benefits, limitations, and alternatives regarding  colonoscopy have been reviewed with the patient.  Questions have been answered.  All parties agreeable. ? ? ?Jonathon Bellows, MD  10/28/2021, 9:36 AM ? ?

## 2021-10-28 NOTE — Anesthesia Preprocedure Evaluation (Addendum)
Anesthesia Evaluation  ?Patient identified by MRN, date of birth, ID band ?Patient awake ? ? ? ?Reviewed: ?Allergy & Precautions, NPO status , Patient's Chart, lab work & pertinent test results ? ?History of Anesthesia Complications ?Negative for: history of anesthetic complications ? ?Airway ?Mallampati: III ? ? ?Neck ROM: Full ? ? ? Dental ? ? ?Missing upper molar:   ?Pulmonary ?neg pulmonary ROS,  ?  ?Pulmonary exam normal ?breath sounds clear to auscultation ? ? ? ? ? ? Cardiovascular ?Exercise Tolerance: Good ?hypertension, Normal cardiovascular exam ?Rhythm:Regular Rate:Normal ? ?ECG 07/03/21: NSR, possible low voltage ?  ?Neuro/Psych ?CVA (2019; residual left hand temperature deficit)   ? GI/Hepatic ?negative GI ROS,   ?Endo/Other  ?Obesity  ? Renal/GU ?negative Renal ROS  ? ?  ?Musculoskeletal ? ? Abdominal ?  ?Peds ? Hematology ?negative hematology ROS ?(+)   ?Anesthesia Other Findings ? ? Reproductive/Obstetrics ? ?  ? ? ? ? ? ? ? ? ? ? ? ? ? ?  ?  ? ? ? ? ? ? ? ?Anesthesia Physical ?Anesthesia Plan ? ?ASA: 2 ? ?Anesthesia Plan: General  ? ?Post-op Pain Management:   ? ?Induction: Intravenous ? ?PONV Risk Score and Plan: 3 and Propofol infusion, TIVA and Treatment may vary due to age or medical condition ? ?Airway Management Planned: Natural Airway ? ?Additional Equipment:  ? ?Intra-op Plan:  ? ?Post-operative Plan:  ? ?Informed Consent: I have reviewed the patients History and Physical, chart, labs and discussed the procedure including the risks, benefits and alternatives for the proposed anesthesia with the patient or authorized representative who has indicated his/her understanding and acceptance.  ? ? ? ? ? ?Plan Discussed with: CRNA ? ?Anesthesia Plan Comments: (LMA/GETA backup discussed.  Patient consented for risks of anesthesia including but not limited to:  ?- adverse reactions to medications ?- damage to eyes, teeth, lips or other oral mucosa ?- nerve damage due  to positioning  ?- sore throat or hoarseness ?- damage to heart, brain, nerves, lungs, other parts of body or loss of life ? ?Informed patient about role of CRNA in peri- and intra-operative care.  Patient voiced understanding.)  ? ? ? ? ? ? ?Anesthesia Quick Evaluation ? ?

## 2021-10-28 NOTE — Op Note (Signed)
[facility] ?Gastroenterology ?Patient Name: Margaret Gaines ?Procedure Date: 10/28/2021 9:33 AM ?MRN: [id number] ?Account #: [id number] ?Date of Birth: [birth year] ?Admit Type: Outpatient ?Age: 51 ?Room: [facility] ENDO ROOM 4 ?Gender: Female ?Note Status: Finalized ?Instrument Name: Colonscope [id number] ?Procedure:             Colonoscopy ?Indications:           Screening for colorectal malignant neoplasm ?Providers:             Jonathon Bellows MD, MD ?Referring MD:          Forest Gleason Md, MD (Referring MD) ?Medicines:             Monitored Anesthesia Care ?Complications:         No immediate complications. ?Procedure:             Pre-Anesthesia Assessment: ?                       - Prior to the procedure, a History and Physical was  ?                       performed, and patient medications, allergies and  ?                       sensitivities were reviewed. The patient's tolerance  ?                       of previous anesthesia was reviewed. ?                       - The risks and benefits of the procedure and the  ?                       sedation options and risks were discussed with the  ?                       patient. All questions were answered and informed  ?                       consent was obtained. ?                       - ASA Grade Assessment: II - A patient with mild  ?                       systemic disease. ?                       After obtaining informed consent, the colonoscope was  ?                       passed under direct vision. Throughout the procedure,  ?                       the patient's blood pressure, pulse, and oxygen  ?                       saturations were monitored continuously. The  ?                       Colonoscope was  introduced through the anus and  ?                       advanced to the the cecum, identified by the  ?                       appendiceal orifice. The colonoscopy was performed  ?                       without difficulty. The patient tolerated the  ?                        procedure well. The quality of the bowel preparation  ?                       was adequate. ?Findings: ?     The perianal and digital rectal examinations were normal. ?     A 5 mm polyp was found in the cecum. The polyp was sessile. The polyp  ?     was removed with a cold snare. Resection and retrieval were complete. ?     Two sessile polyps were found in the ascending colon. The polyps were 3  ?     to 4 mm in size. These polyps were removed with a cold biopsy forceps.  ?     Resection and retrieval were complete. ?     Multiple small-mouthed diverticula were found in the sigmoid colon. ?     The exam was otherwise without abnormality on direct and retroflexion  ?     views. ?Impression:            - One 5 mm polyp in the cecum, removed with a cold  ?                       snare. Resected and retrieved. ?                       - Two 3 to 4 mm polyps in the ascending colon, removed  ?                       with a cold biopsy forceps. Resected and retrieved. ?                       - Diverticulosis in the sigmoid colon. ?                       - The examination was otherwise normal on direct and  ?                       retroflexion views. ?Recommendation:        - Discharge patient to home (with escort). ?                       - Resume previous diet. ?                       - Continue present medications. ?                       - Await pathology results. ?                       -  Repeat colonoscopy for surveillance based on  ?                       pathology results. ?Procedure Code(s):     --- Professional --- ?                       [phone], Colonoscopy, flexible; with removal of  ?                       tumor(s), polyp(s), or other lesion(s) by snare  ?                       technique ?                       [id number], 59, Colonoscopy, flexible; with biopsy, single  ?                       or multiple ?Diagnosis Code(s):     --- Professional --- ?                       K63.5, Polyp of colon ?                        Z12.11, Encounter for screening for malignant neoplasm  ?                       of colon ?                       K57.30, Diverticulosis of large intestine without  ?                       perforation or abscess without bleeding ?CPT copyright 2019 American Medical Association. All rights reserved. ?The codes documented in this report are preliminary and upon coder review may  ?be revised to meet current compliance requirements. ?Jonathon Bellows, MD ?Jonathon Bellows MD, MD ?10/28/2021 10:10:39 AM ?This report has been signed electronically. ?Number of Addenda: 0 ?Note Initiated On: 10/28/2021 9:33 AM ?Scope Withdrawal Time: 0 hours 16 minutes 34 seconds  ?Total Procedure Duration: 0 hours 20 minutes 0 seconds  ?Estimated Blood Loss:  Estimated blood loss: none. ?     [facility] ?

## 2021-10-29 ENCOUNTER — Encounter: Payer: Self-pay | Admitting: Gastroenterology

## 2021-10-29 LAB — SURGICAL PATHOLOGY

## 2021-10-30 ENCOUNTER — Encounter: Payer: Self-pay | Admitting: Gastroenterology

## 2022-06-09 ENCOUNTER — Encounter: Payer: Self-pay | Admitting: Psychiatry

## 2022-06-09 ENCOUNTER — Ambulatory Visit (INDEPENDENT_AMBULATORY_CARE_PROVIDER_SITE_OTHER): Payer: BC Managed Care – PPO | Admitting: Psychiatry

## 2022-06-09 VITALS — BP 137/86 | HR 57 | Temp 97.9°F | Ht 64.5 in | Wt 213.0 lb

## 2022-06-09 DIAGNOSIS — F419 Anxiety disorder, unspecified: Secondary | ICD-10-CM | POA: Diagnosis not present

## 2022-06-09 DIAGNOSIS — F32A Depression, unspecified: Secondary | ICD-10-CM | POA: Diagnosis not present

## 2022-06-09 MED ORDER — SERTRALINE HCL 25 MG PO TABS: 25.0000 mg | ORAL_TABLET | Freq: Every day | ORAL | 1 refills | Status: AC
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
## 2022-06-09 NOTE — Patient Instructions (Signed)
Sertraline Tablets What is this medication? SERTRALINE (SER tra leen) treats depression, anxiety, obsessive-compulsive disorder (OCD), post-traumatic stress disorder (PTSD), and premenstrual dysphoric disorder (PMDD). It increases the amount of serotonin in the brain, a hormone that helps regulate mood. It belongs to a group of medications called SSRIs. This medicine may be used for other purposes; ask your health care provider or pharmacist if you have questions. COMMON BRAND NAME(S): Zoloft What should I tell my care team before I take this medication? They need to know if you have any of these conditions: Bleeding disorders Bipolar disorder or a family history of bipolar disorder Frequently drink alcohol Glaucoma Heart disease High blood pressure History of irregular heartbeat History of low levels of calcium, magnesium, or potassium in the blood Liver disease Receiving electroconvulsive therapy Seizures Suicidal thoughts, plans, or attempt by you or a family member Take medications that prevent or treat blood clots Thyroid disease An unusual or allergic reaction to sertraline, other medications, foods, dyes, or preservatives Pregnant or trying to get pregnant Breastfeeding How should I use this medication? Take this medication by mouth with a glass of water. Take it as directed on the prescription label at the same time every day. You can take it with or without food. If it upsets your stomach, take it with food. Do not take your medication more often than directed. Keep taking this medication unless your care team tells you to stop. Stopping it too quickly can cause serious side effects. It can also make your condition worse. A special MedGuide will be given to you by the pharmacist with each prescription and refill. Be sure to read this information carefully each time. Talk to your care team about the use of this medication in children. While it may be prescribed for children as  young as 7 years for selected conditions, precautions do apply. Overdosage: If you think you have taken too much of this medicine contact a poison control center or emergency room at once. NOTE: This medicine is only for you. Do not share this medicine with others. What if I miss a dose? If you miss a dose, take it as soon as you can. If it is almost time for your next dose, take only that dose. Do not take double or extra doses. What may interact with this medication? Do not take this medication with any of the following: Cisapride Dronedarone Linezolid MAOIs, such as Carbex, Eldepryl, Marplan, Nardil, and Parnate Methylene blue (injected into a vein) Pimozide Thioridazine This medication may also interact with the following: Alcohol Amphetamines Aspirin and aspirin-like medications Certain medications for fungal infections, such as ketoconazole, fluconazole, posaconazole, itraconazole Certain medications for irregular heart beat, such as flecainide, quinidine, propafenone Certain medications for mental health conditions Certain medications for migraine headaches, such as almotriptan, eletriptan, frovatriptan, naratriptan, rizatriptan, sumatriptan, zolmitriptan Certain medications for seizures, such as carbamazepine, valproic acid, phenytoin Certain medications for sleep Certain medications that prevent or treat blood clots, such as warfarin, enoxaparin, dalteparin Cimetidine Digoxin Diuretics Fentanyl Isoniazid Lithium NSAIDs, medications for pain and inflammation, such as ibuprofen or naproxen Other medications that cause heart rhythm changes, such as dofetilide Rasagiline Safinamide Supplements, such as St. John's wort, kava kava, valerian Tolbutamide Tramadol Tryptophan This list may not describe all possible interactions. Give your health care provider a list of all the medicines, herbs, non-prescription drugs, or dietary supplements you use. Also tell them if you smoke,  drink alcohol, or use illegal drugs. Some items may interact with your medicine.  What should I watch for while using this medication? Tell your care team if your symptoms do not get better or if they get worse. Visit your care team for regular checks on your progress. Because it may take several weeks to see the full effects of this medication, it is important to continue your treatment as prescribed by your care team. Patients and their families should watch out for new or worsening thoughts of suicide or depression. Also watch out for sudden changes in feelings such as feeling anxious, agitated, panicky, irritable, hostile, aggressive, impulsive, severely restless, overly excited and hyperactive, or not being able to sleep. If this happens, especially at the beginning of treatment or after a change in dose, call your care team. This medication may affect your coordination, reaction time, or judgment. Do not drive or operate machinery until you know how this medication affects you. Sit or stand up slowly to reduce the risk of dizzy or fainting spells. Drinking alcohol with this medication can increase the risk of these side effects. Your mouth may get dry. Chewing sugarless gum or sucking hard candy, and drinking plenty of water may help. Contact your care team if the problem does not go away or is severe. What side effects may I notice from receiving this medication? Side effects that you should report to your care team as soon as possible: Allergic reactions--skin rash, itching, hives, swelling of the face, lips, tongue, or throat Bleeding--bloody or black, tar-like stools, red or dark brown urine, vomiting blood or brown material that looks like coffee grounds, small red or purple spots on skin, unusual bleeding or bruising Heart rhythm changes--fast or irregular heartbeat, dizziness, feeling faint or lightheaded, chest pain, trouble breathing Low sodium level--muscle weakness, fatigue, dizziness,  headache, confusion Serotonin syndrome--irritability, confusion, fast or irregular heartbeat, muscle stiffness, twitching muscles, sweating, high fever, seizure, chills, vomiting, diarrhea Sudden eye pain or change in vision such as blurred vision, seeing halos around lights, vision loss Thoughts of suicide or self-harm, worsening mood Side effects that usually do not require medical attention (report these to your care team if they continue or are bothersome): Change in sex drive or performance Diarrhea Excessive sweating Nausea Tremors or shaking Upset stomach This list may not describe all possible side effects. Call your doctor for medical advice about side effects. You may report side effects to FDA at 1-800-FDA-1088. Where should I keep my medication? Keep out of the reach of children and pets. Store at room temperature between 20 and 25 degrees C (68 and 77 degrees F). Get rid of any unused medication after the expiration date. To get rid of medications that are no longer needed or expired: Take the medication to a medication take-back program. Check with your pharmacy or law enforcement to find a location. If you cannot return the medication, check the label or package insert to see if the medication should be thrown out in the garbage or flushed down the toilet. If you are not sure, ask your care team. If it is safe to put in the trash, empty the medication out of the container. Mix the medication with cat litter, dirt, coffee grounds, or other unwanted substance. Seal the mixture in a bag or container. Put it in the trash. NOTE: This sheet is a summary. It may not cover all possible information. If you have questions about this medicine, talk to your doctor, pharmacist, or health care provider.  2023 [employer] Standard (2022-02-04 00:00:00)

## 2022-08-05 ENCOUNTER — Ambulatory Visit: Payer: BC Managed Care – PPO | Admitting: Psychiatry

## 2023-07-15 IMAGING — CT CT HEAD W/O CM
4 series · 16 of 47 positions shown, 18 images · non-contrast
Comparison: 12/01/2017

CLINICAL DATA: Hypertension, headache

EXAM:
CT HEAD WITHOUT CONTRAST
TECHNIQUE: Contiguous axial images were obtained from the base of the skull
through the vertex without intravenous contrast.

[Series 2: head wo · axial · 0.42mm/px · z∈[+86,+191]mm · 7 of 29 slices shown, 9 images]
[im 4/29  brain]
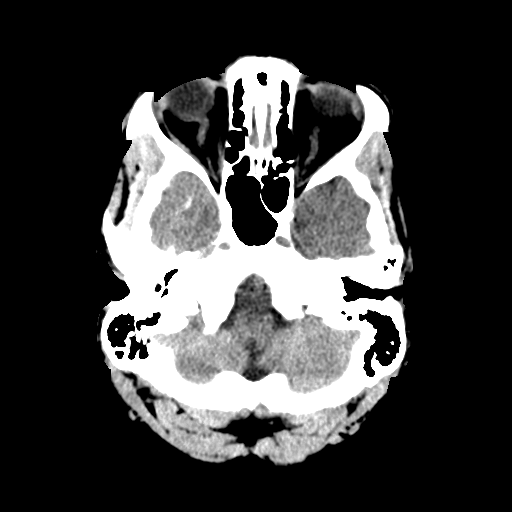
[im 4/29  bone]
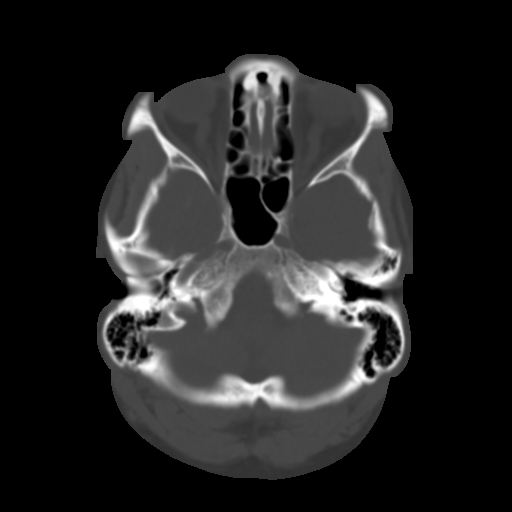
[im 8/29  brain]
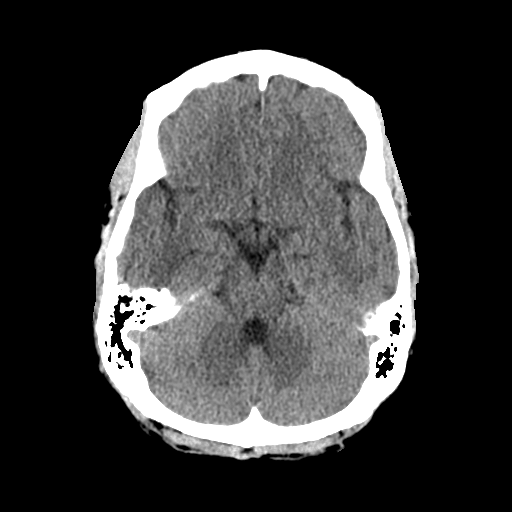
[im 11/29  brain]
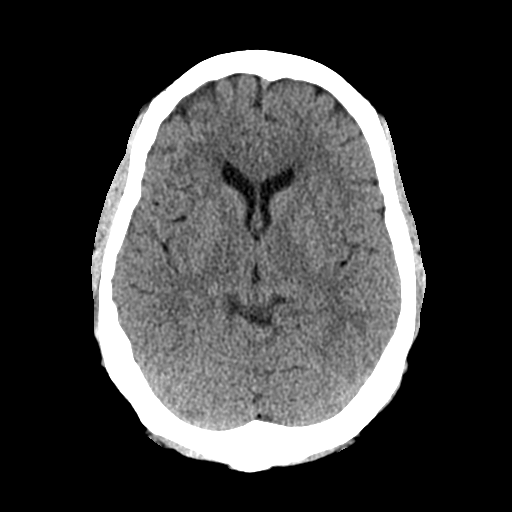
[im 15/29  brain]
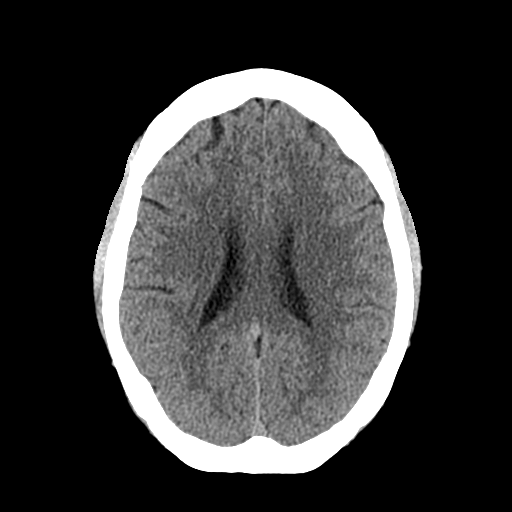
[im 18/29  brain]
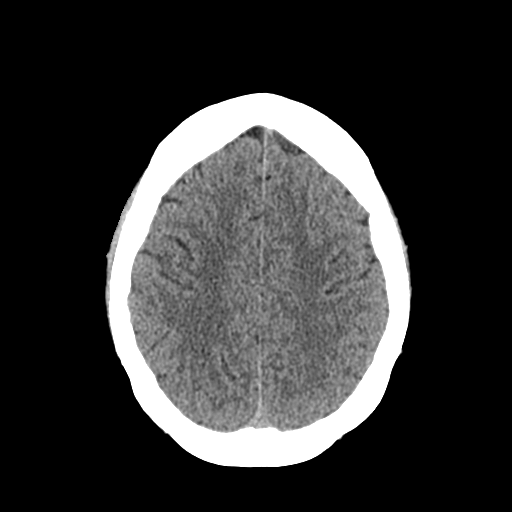
[im 18/29  bone]
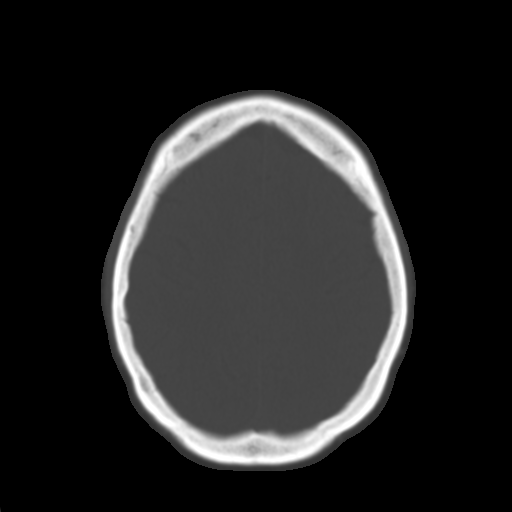
[im 22/29  brain]
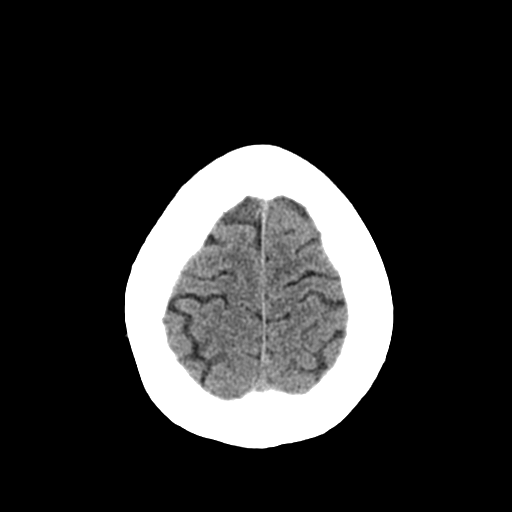
[im 25/29  brain]
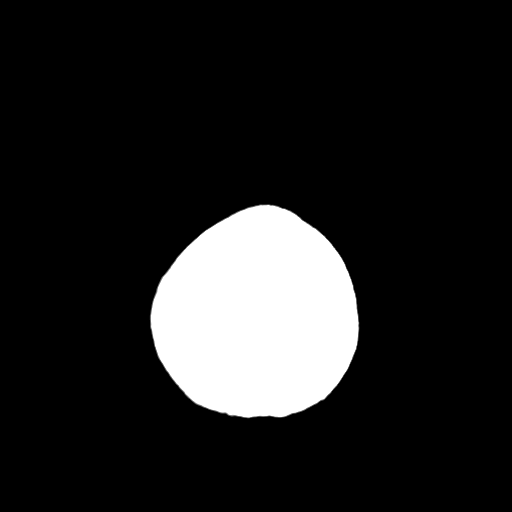

[Series 3: head bone · axial · 0.42mm/px · z∈[+85,+113]mm · 3 of 72 slices shown]
[im 8/72  bone]
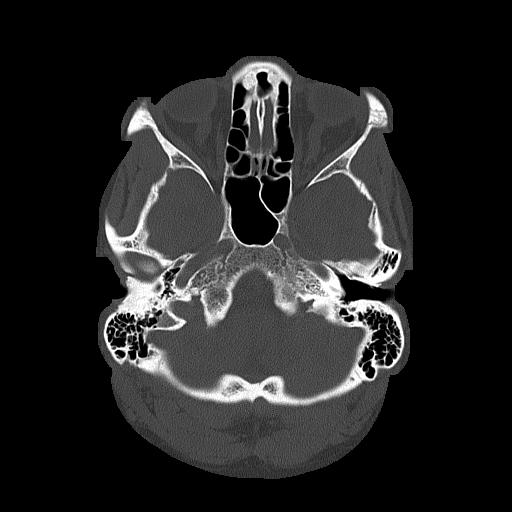
[im 15/72  bone]
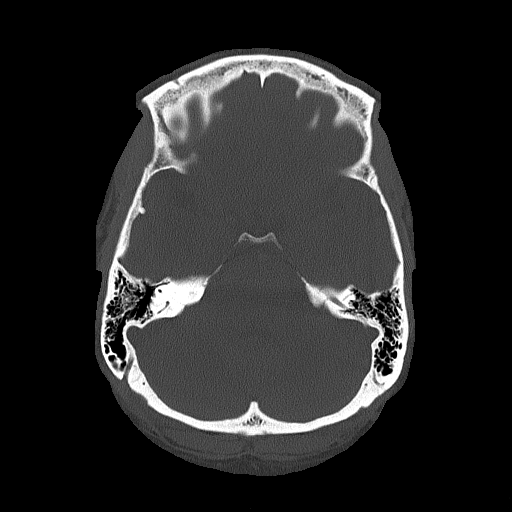
[im 22/72  bone]
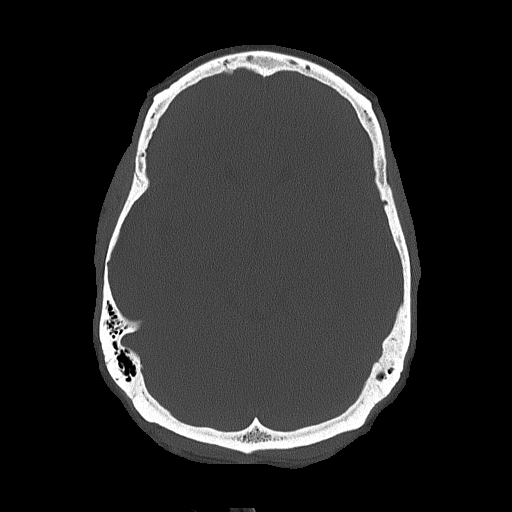

[Series 4: coronal soft tissue · coronal · 0.29mm/px · 3 of 63 slices shown]
[im 21/63  brain]
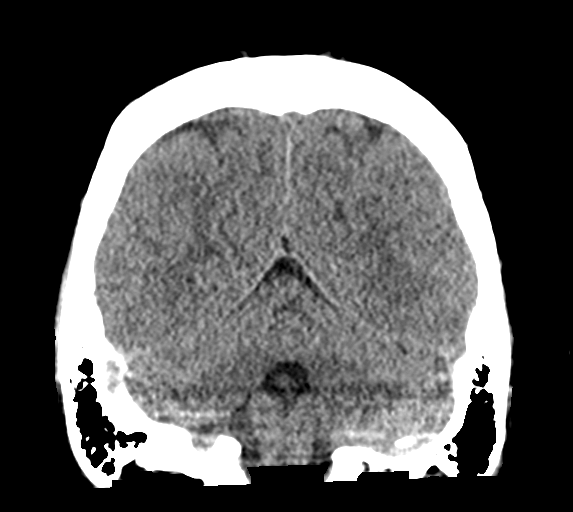
[im 28/63  brain]
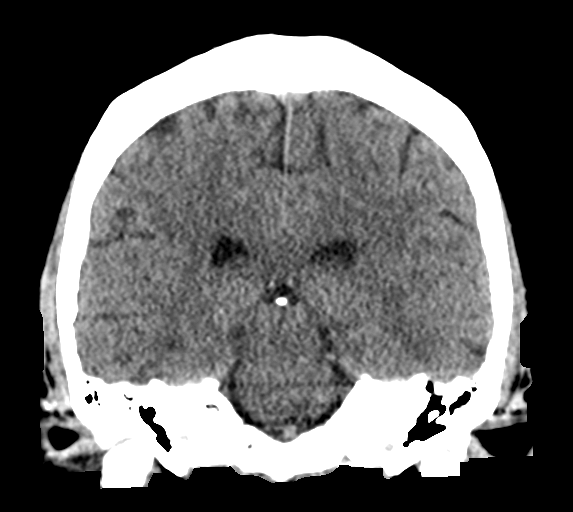
[im 35/63  brain]
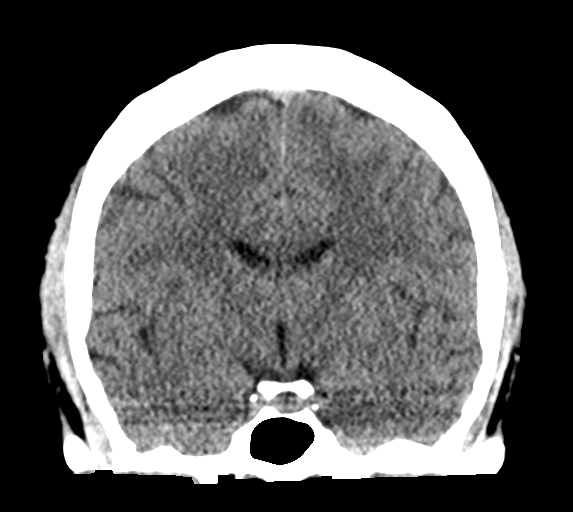

[Series 5: sagittal soft tissue · sagittal · 0.29mm/px · 3 of 55 slices shown]
[im 19/55  brain]
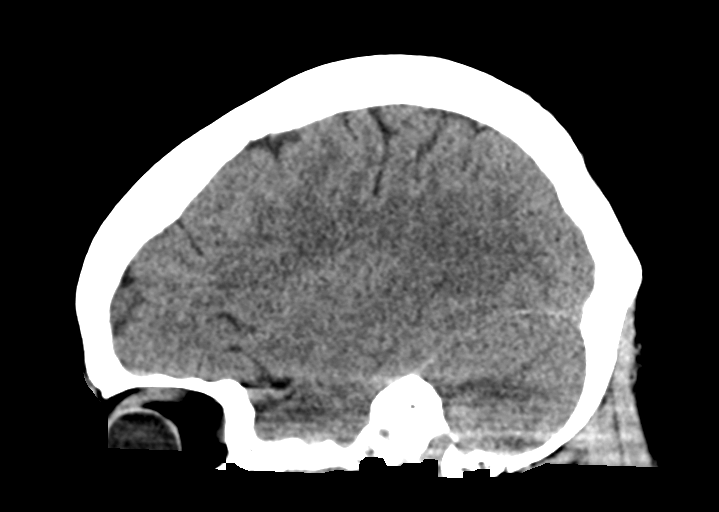
[im 28/55  brain]
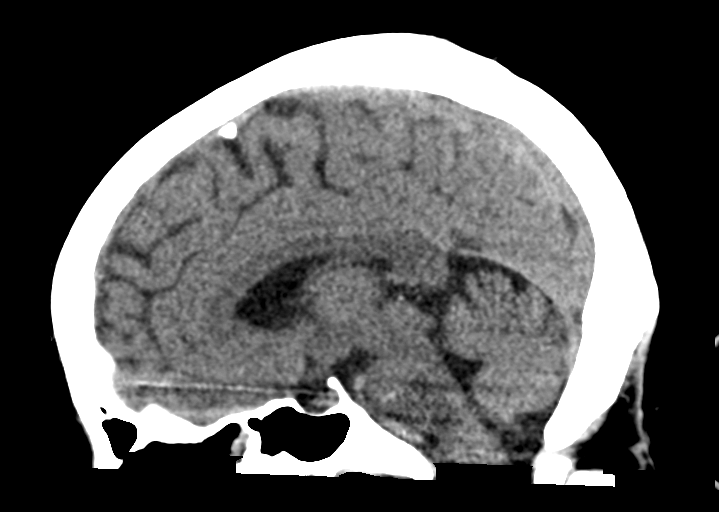
[im 37/55  brain]
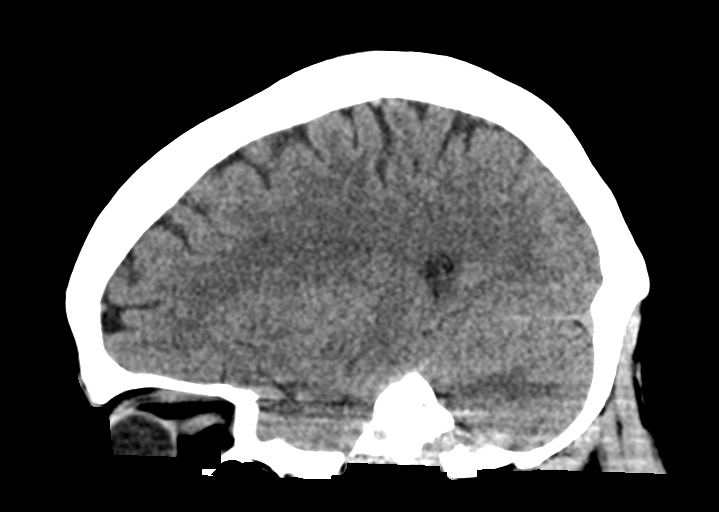

[16 of 47 positions shown; findings below may reference images not displayed]

FINDINGS: Brain: No acute intracranial abnormality. Specifically, no
hemorrhage, hydrocephalus, mass lesion, acute infarction, or
significant intracranial injury.

Vascular: No hyperdense vessel or unexpected calcification.

Skull: No acute calvarial abnormality.

Sinuses/Orbits: No acute findings

Other: None
IMPRESSION: Normal study.

## 2024-04-21 ENCOUNTER — Telehealth: Payer: Self-pay

## 2024-04-21 NOTE — Telephone Encounter (Signed)
 Referral received from patients PCP Dr. Donnice Bough to schedule her colonoscopy.  Chart reviewed.  Pt was advised that her colonoscopy will be due in April of 2026.  She will be contacted closer to April to schedule.  Thanks,  [place], CMA
